# Patient Record
Sex: Male | Born: 1945 | Race: White | Hispanic: No | Marital: Married | Smoking: Former smoker
Health system: Southern US, Community
[De-identification: ages and names within clinical notes are randomized; demographics above are authoritative.]

## PROBLEM LIST (undated history)

## (undated) DIAGNOSIS — R3915 Urgency of urination: Secondary | ICD-10-CM

## (undated) DIAGNOSIS — D126 Benign neoplasm of colon, unspecified: Secondary | ICD-10-CM

## (undated) DIAGNOSIS — R12 Heartburn: Secondary | ICD-10-CM

## (undated) DIAGNOSIS — M1990 Unspecified osteoarthritis, unspecified site: Secondary | ICD-10-CM

## (undated) DIAGNOSIS — H409 Unspecified glaucoma: Secondary | ICD-10-CM

## (undated) DIAGNOSIS — R1310 Dysphagia, unspecified: Secondary | ICD-10-CM

## (undated) DIAGNOSIS — N200 Calculus of kidney: Secondary | ICD-10-CM

## (undated) DIAGNOSIS — E291 Testicular hypofunction: Secondary | ICD-10-CM

## (undated) DIAGNOSIS — E559 Vitamin D deficiency, unspecified: Secondary | ICD-10-CM

## (undated) DIAGNOSIS — Z8711 Personal history of peptic ulcer disease: Secondary | ICD-10-CM

## (undated) DIAGNOSIS — N401 Enlarged prostate with lower urinary tract symptoms: Secondary | ICD-10-CM

## (undated) DIAGNOSIS — E785 Hyperlipidemia, unspecified: Secondary | ICD-10-CM

## (undated) DIAGNOSIS — Z8719 Personal history of other diseases of the digestive system: Secondary | ICD-10-CM

## (undated) DIAGNOSIS — R350 Frequency of micturition: Secondary | ICD-10-CM

## (undated) DIAGNOSIS — E669 Obesity, unspecified: Secondary | ICD-10-CM

## (undated) DIAGNOSIS — N529 Male erectile dysfunction, unspecified: Secondary | ICD-10-CM

## (undated) HISTORY — DX: Personal history of other diseases of the digestive system: Z87.19

## (undated) HISTORY — DX: Vitamin D deficiency, unspecified: E55.9

## (undated) HISTORY — DX: Benign prostatic hyperplasia with lower urinary tract symptoms: N40.1

## (undated) HISTORY — DX: Benign neoplasm of colon, unspecified: D12.6

## (undated) HISTORY — DX: Frequency of micturition: R35.0

## (undated) HISTORY — PX: EYE SURGERY: SHX253

## (undated) HISTORY — PX: LITHOTRIPSY: SUR834

## (undated) HISTORY — DX: Obesity, unspecified: E66.9

## (undated) HISTORY — DX: Urgency of urination: R39.15

## (undated) HISTORY — DX: Dysphagia, unspecified: R13.10

## (undated) HISTORY — DX: Heartburn: R12

## (undated) HISTORY — PX: RETINAL DETACHMENT SURGERY: SHX105

## (undated) HISTORY — PX: APPENDECTOMY: SHX54

## (undated) HISTORY — PX: CATARACT EXTRACTION: SUR2

## (undated) HISTORY — DX: Testicular hypofunction: E29.1

## (undated) HISTORY — DX: Hyperlipidemia, unspecified: E78.5

## (undated) HISTORY — DX: Male erectile dysfunction, unspecified: N52.9

## (undated) HISTORY — DX: Personal history of peptic ulcer disease: Z87.11

---

## 2009-01-02 ENCOUNTER — Ambulatory Visit: Payer: Self-pay | Admitting: Family Medicine

## 2010-10-26 ENCOUNTER — Ambulatory Visit: Payer: Self-pay | Admitting: Family Medicine

## 2010-12-15 ENCOUNTER — Ambulatory Visit: Payer: Self-pay | Admitting: Internal Medicine

## 2014-02-01 ENCOUNTER — Emergency Department: Payer: Self-pay | Admitting: Emergency Medicine

## 2014-02-09 ENCOUNTER — Ambulatory Visit: Payer: Self-pay | Admitting: Orthopedic Surgery

## 2014-04-16 ENCOUNTER — Ambulatory Visit: Payer: Self-pay | Admitting: Emergency Medicine

## 2014-04-19 ENCOUNTER — Ambulatory Visit: Payer: Self-pay | Admitting: Family Medicine

## 2015-03-08 DIAGNOSIS — N401 Enlarged prostate with lower urinary tract symptoms: Secondary | ICD-10-CM

## 2015-03-08 DIAGNOSIS — N138 Other obstructive and reflux uropathy: Secondary | ICD-10-CM | POA: Insufficient documentation

## 2015-05-26 ENCOUNTER — Ambulatory Visit (INDEPENDENT_AMBULATORY_CARE_PROVIDER_SITE_OTHER): Payer: PPO | Admitting: Urology

## 2015-05-26 ENCOUNTER — Other Ambulatory Visit: Payer: Self-pay

## 2015-05-26 ENCOUNTER — Encounter: Payer: Self-pay | Admitting: *Deleted

## 2015-05-26 ENCOUNTER — Ambulatory Visit
Admission: RE | Admit: 2015-05-26 | Discharge: 2015-05-26 | Disposition: A | Discharge status: Home / Self Care | Payer: PPO | Source: Ambulatory Visit | Attending: Urology | Admitting: Urology

## 2015-05-26 ENCOUNTER — Emergency Department
Admission: EM | Admit: 2015-05-26 | Discharge: 2015-05-26 | Disposition: A | Discharge status: Home / Self Care | Payer: PPO | Attending: Emergency Medicine | Admitting: Emergency Medicine

## 2015-05-26 ENCOUNTER — Telehealth: Payer: Self-pay | Admitting: Urology

## 2015-05-26 ENCOUNTER — Other Ambulatory Visit: Payer: Self-pay | Admitting: Urology

## 2015-05-26 ENCOUNTER — Emergency Department: Payer: PPO

## 2015-05-26 ENCOUNTER — Encounter: Payer: Self-pay | Admitting: Urology

## 2015-05-26 ENCOUNTER — Encounter: Payer: Self-pay | Admitting: Urgent Care

## 2015-05-26 VITALS — BP 134/80 | HR 69 | Ht 67.0 in | Wt 148.0 lb

## 2015-05-26 DIAGNOSIS — N200 Calculus of kidney: Secondary | ICD-10-CM | POA: Insufficient documentation

## 2015-05-26 DIAGNOSIS — N201 Calculus of ureter: Secondary | ICD-10-CM

## 2015-05-26 DIAGNOSIS — D126 Benign neoplasm of colon, unspecified: Secondary | ICD-10-CM | POA: Insufficient documentation

## 2015-05-26 DIAGNOSIS — N202 Calculus of kidney with calculus of ureter: Secondary | ICD-10-CM | POA: Diagnosis not present

## 2015-05-26 DIAGNOSIS — H409 Unspecified glaucoma: Secondary | ICD-10-CM | POA: Insufficient documentation

## 2015-05-26 DIAGNOSIS — M1990 Unspecified osteoarthritis, unspecified site: Secondary | ICD-10-CM | POA: Insufficient documentation

## 2015-05-26 DIAGNOSIS — R109 Unspecified abdominal pain: Secondary | ICD-10-CM

## 2015-05-26 HISTORY — DX: Unspecified glaucoma: H40.9

## 2015-05-26 HISTORY — DX: Unspecified osteoarthritis, unspecified site: M19.90

## 2015-05-26 HISTORY — DX: Calculus of kidney: N20.0

## 2015-05-26 LAB — URINALYSIS COMPLETE WITH MICROSCOPIC (ARMC ONLY)
Bilirubin Urine: NEGATIVE
GLUCOSE, UA: NEGATIVE mg/dL
Hgb urine dipstick: NEGATIVE
KETONES UR: NEGATIVE mg/dL
Leukocytes, UA: NEGATIVE
NITRITE: NEGATIVE
PH: 5 (ref 5.0–8.0)
Protein, ur: NEGATIVE mg/dL
SQUAMOUS EPITHELIAL / LPF: NONE SEEN
Specific Gravity, Urine: 1.016 (ref 1.005–1.030)

## 2015-05-26 LAB — BASIC METABOLIC PANEL
Anion gap: 7 (ref 5–15)
BUN: 15 mg/dL (ref 6–20)
CO2: 26 mmol/L (ref 22–32)
Calcium: 8.9 mg/dL (ref 8.9–10.3)
Chloride: 106 mmol/L (ref 101–111)
Creatinine, Ser: 1.12 mg/dL (ref 0.61–1.24)
GFR calc Af Amer: >60 mL/min (ref >60)
GFR calc non Af Amer: >60 mL/min (ref >60)
Glucose, Bld: 142 mg/dL — ABNORMAL HIGH (ref 65–99)
Potassium: 4.1 mmol/L (ref 3.5–5.1)
Sodium: 139 mmol/L (ref 135–145)

## 2015-05-26 LAB — CBC
HCT: 36.7 % — ABNORMAL LOW (ref 40.0–52.0)
HEMOGLOBIN: 12.5 g/dL — AB (ref 13.0–18.0)
MCH: 31.6 pg (ref 26.0–34.0)
MCHC: 34.2 g/dL (ref 32.0–36.0)
MCV: 92.4 fL (ref 80.0–100.0)
Platelets: 198 10*3/uL (ref 150–440)
RBC: 3.98 MIL/uL — ABNORMAL LOW (ref 4.40–5.90)
RDW: 12.9 % (ref 11.5–14.5)
WBC: 9.6 10*3/uL (ref 3.8–10.6)

## 2015-05-26 MED ORDER — TAMSULOSIN HCL 0.4 MG PO CAPS: 0.4000 mg | ORAL_CAPSULE | Freq: Every day | ORAL | Status: AC

## 2015-05-26 MED ORDER — MORPHINE SULFATE 4 MG/ML IJ SOLN
INTRAMUSCULAR | Status: AC
  Filled 2015-05-26: qty 1

## 2015-05-26 MED ORDER — ONDANSETRON HCL 4 MG/2ML IJ SOLN
4.0000 mg | Freq: Once | INTRAMUSCULAR | Status: AC
  Administered 2015-05-26: 4 mg via INTRAVENOUS

## 2015-05-26 MED ORDER — KETOROLAC TROMETHAMINE 30 MG/ML IJ SOLN: 30.0000 mg | Freq: Once | INTRAMUSCULAR | Status: DC

## 2015-05-26 MED ORDER — ONDANSETRON HCL 4 MG/2ML IJ SOLN
INTRAMUSCULAR | Status: AC
  Filled 2015-05-26: qty 2

## 2015-05-26 MED ORDER — OXYCODONE-ACETAMINOPHEN 5-325 MG PO TABS: 1.0000 | ORAL_TABLET | Freq: Four times a day (QID) | ORAL | Status: DC | PRN

## 2015-05-26 MED ORDER — MORPHINE SULFATE 4 MG/ML IJ SOLN
4.0000 mg | Freq: Once | INTRAMUSCULAR | Status: AC
  Administered 2015-05-26: 4 mg via INTRAVENOUS

## 2015-05-26 MED ORDER — SODIUM CHLORIDE 0.9 % IV BOLUS (SEPSIS)
1000.0000 mL | Freq: Once | INTRAVENOUS | Status: AC
  Administered 2015-05-26: 1000 mL via INTRAVENOUS

## 2015-05-26 MED ORDER — ONDANSETRON 4 MG PO TBDP: 4.0000 mg | ORAL_TABLET | Freq: Three times a day (TID) | ORAL | Status: DC | PRN

## 2015-05-26 NOTE — Discharge Instructions (Signed)

## 2015-05-26 NOTE — ED Provider Notes (Signed)
[facility] Emergency Department Provider Note  ____________________________________________  Time seen: Approximately  AM  I have reviewed the triage vital signs and the nursing notes.   HISTORY  Chief Complaint Flank Pain    HPI Dylan Young is a 69 y.o. male who thinks he has a kidney stone.The patient reports that he started having some right-sided flank pain at 1 AM. He reports that he took a few extra strength Tylenol and it helped a tiny bit but the pain has continued. The patient reports his last kidney stone was approximately 15 years ago and he has not seen his urologist since then. The patient has not noticed any blood in his urine or burning with urination. He has had some nausea with no emesis. He reports that his pain is a 9 out of 10 in intensity. He reports that the pain is so bad that he is unable to sit at this time because it just makes the pain worse. He says that the pain has radiated up his back some but has not radiated around to his abdomen or into his groin.   Past Medical History  Diagnosis Date  . Glaucoma   . Kidney stones   . Arthritis     There are no active problems to display for this patient.   Past Surgical History  Procedure Laterality Date  . Eye surgery      No current outpatient prescriptions on file.  Allergies Review of patient's allergies indicates no known allergies.  No family history on file.  Social History History  Substance Use Topics  . Smoking status: Never Smoker   . Smokeless tobacco: Not on file  . Alcohol Use: No    Review of Systems Constitutional: No fever/chills Eyes: No visual changes. ENT: No sore throat. Cardiovascular: Denies chest pain. Respiratory: Denies shortness of breath. Gastrointestinal: Nausea, No abdominal pain.  No vomiting Genitourinary: Negative for dysuria. Musculoskeletal: Right flank pain Skin: Negative for rash. Neurological: Negative for  headaches,  10-point ROS otherwise negative.  ____________________________________________   PHYSICAL EXAM:  VITAL SIGNS: ED Triage Vitals  Enc Vitals Group     BP 05/26/15 0335 143/86 mmHg     Pulse Rate 05/26/15 0335 60     Resp 05/26/15 0335 16     Temp 05/26/15 0335 97.9 F (36.6 C)     Temp Source 05/26/15 0335 Oral     SpO2 05/26/15 0335 100 %     Weight 05/26/15 0335 163 lb (73.936 kg)     Height 05/26/15 0335 5\' 7"  (1.702 m)     Head Cir --      Peak Flow --      Pain Score 05/26/15 0335 8     Pain Loc --      Pain Edu? --      Excl. in [place]? --     Constitutional: Alert and oriented. Well appearing and in no acute distress. Eyes: Conjunctivae are normal. PERRL. EOMI. Head: Atraumatic. Nose: No congestion/rhinnorhea. Mouth/Throat: Mucous membranes are moist.  Oropharynx non-erythematous. Cardiovascular: Normal rate, regular rhythm. Grossly normal heart sounds.  Good peripheral circulation. Respiratory: Normal respiratory effort.  No retractions. Lungs CTAB. Gastrointestinal: Soft and nontender. No distention. Right CVA tenderness. Genitourinary: Deferred Musculoskeletal: No lower extremity tenderness nor edema.  No joint effusions. Neurologic:  Normal speech and language. No gross focal neurologic deficits are appreciated.  Skin:  Skin is warm, dry and intact. No rash noted. Psychiatric: Mood and affect are normal.  ____________________________________________   LABS (all labs ordered are listed, but only abnormal results are displayed)  Labs Reviewed  URINALYSIS COMPLETEWITH MICROSCOPIC ([place]) - Abnormal; Notable for the following:    Color, Urine YELLOW (*)    APPearance CLEAR (*)    Bacteria, UA RARE (*)    All other components within normal limits  BASIC METABOLIC PANEL   ____________________________________________  EKG  None ____________________________________________  RADIOLOGY  CT scan: Obstructing 10 mm stone in the right  proximal ureter just distal to the ureteropelvic junction with resultant mild hydronephrosis, additional nonobstructing stone in the lower right kidney. Multiple scattered small hepatic hypodensities likely a cyst or biliary hamartomas. ____________________________________________   PROCEDURES  Procedure(s) performed: None  Critical Care performed: No  ____________________________________________   INITIAL IMPRESSION / ASSESSMENT AND PLAN / ED COURSE  Pertinent labs & imaging results that were available during my care of the patient were reviewed by me and considered in my medical decision making (see chart for details).  This is a 69 year old male who comes in with right flank pain starting last night. The patient does have significant stone in his right ureter. I will give the patient some morphine and Zofran and normal saline. Tomorrow is the typical day to perform lithotripsies. I will attempt to contact urology to schedule lithotripsy for the patient  I did contact Dr. Erlene Quan who reports that she will attempt to see the patient in clinic so that he may receive a lithotripsy tomorrow. ____________________________________________   FINAL CLINICAL IMPRESSION(S) / ED DIAGNOSES  Final diagnoses:  Flank pain      Loney Hering, MD 05/26/15 [phone]

## 2015-05-26 NOTE — Progress Notes (Signed)
05/26/2015 5:21 PM   Stein Windhorst Earnhart 1946-04-18 [id number]  Referring provider: No referring provider defined for this encounter.  Chief Complaint  Patient presents with  . Nephrolithiasis    New Patient- ED stones    HPI:  69 year old male who presents today for follow-up from the emergency room with a 10 mm right UPJ stone with mild hydronephrosis. His pain started acutely at 1 AM this morning with associated nausea. In the emergency room, his pain was able to be controlled with IV and now oral pain medications.  He had no evidence of leukocytosis, and his UA was negative.  His creatinine was within normal limits.  He does have a personal history of kidney stones, passed a stone 15 years ago spontaneously without need for surgical intervention.  He has seen a urologist in [place] for his kidney stones but he elected to monitor his stone rather than treated at that time.  No gross hematuria, dysuria, or any other significant urinary symptoms today.  No fevers or chills.    PMH: Past Medical History  Diagnosis Date  . Glaucoma   . Kidney stones   . Arthritis     Surgical History: Past Surgical History  Procedure Laterality Date  . Eye surgery      Home Medications:    Medication List       This list is accurate as of: 05/26/15  5:21 PM.  Always use your most recent med list.               bromfenac 0.09 % ophthalmic solution  Commonly known as:  XIBROM  Apply to eye.     Dorzolamide HCl-Timolol Mal PF 22.3-6.8 MG/ML Soln  Place 1 drop into both eyes 2 (two) times daily.     doxazosin 4 MG 24 hr tablet  Commonly known as:  CARDURA XL  Take by mouth.     doxazosin 4 MG tablet  Commonly known as:  CARDURA  Take 4 mg by mouth daily with breakfast.     dutasteride 0.5 MG capsule  Commonly known as:  AVODART  Take 0.5 mg by mouth daily.     latanoprost 0.005 % ophthalmic solution  Commonly known as:  XALATAN  Apply to eye.     MULTIVITAMIN ADULT  PO  Take 1 tablet by mouth daily.     ondansetron 4 MG disintegrating tablet  Commonly known as:  ZOFRAN ODT  Take 1 tablet (4 mg total) by mouth every 8 (eight) hours as needed for nausea or vomiting.     oxyCODONE-acetaminophen 5-325 MG per tablet  Commonly known as:  ROXICET  Take 1-2 tablets by mouth every 6 (six) hours as needed.     tamsulosin 0.4 MG Caps capsule  Commonly known as:  FLOMAX  Take 1 capsule (0.4 mg total) by mouth daily.     VITAMIN B PLUS+ PO  Take 1 tablet by mouth daily.     vitamin C 500 MG tablet  Commonly known as:  ASCORBIC ACID  Take 500 mg by mouth 4 (four) times daily.     Vitamin D-3 1000 UNITS Caps  Take 1 capsule by mouth daily.     vitamin E 400 UNIT capsule  Take 400 Units by mouth daily.        Allergies: No Known Allergies  Family History: History reviewed. No pertinent family history.  Social History:  reports that he has quit smoking. He quit smokeless tobacco use about 40 years  ago. He reports that he does not drink alcohol or use illicit drugs.   ROS: Urological Symptom Review  Patient is experiencing the following symptoms: Frequent urination Hard to postpone urination Get up at night to urinate   Review of Systems  Gastrointestinal (upper)  : Nausea  Gastrointestinal (lower) : Negative for lower GI symptoms  Constitutional : Negative for symptoms  Skin: Negative for skin symptoms  Eyes: Blurred vision  Ear/Nose/Throat : Negative for Ear/Nose/Throat symptoms  Hematologic/Lymphatic: Negative for Hematologic/Lymphatic symptoms  Cardiovascular : Negative for cardiovascular symptoms  Respiratory : Negative for respiratory symptoms  Endocrine: Negative for endocrine symptoms  Musculoskeletal: Back pain  Neurological: Negative for neurological symptoms  Psychologic: Negative for psychiatric symptoms   Physical Exam:  BP 134/80 mmHg  Pulse 69  Ht 5\' 7"  (1.702 m)  Wt 148 lb (67.132 kg)   BMI 23.17 kg/m2    Constitutional:  Alert and oriented, No acute distress. HEENT: [place] AT, moist mucus membranes.  Trachea midline, no masses. Cardiovascular: No clubbing, cyanosis, or edema. Respiratory: Normal respiratory effort, no increased work of breathing. GI: Abdomen is soft, nontender, nondistended, no abdominal masses GU: Mild right CVA tenderness. Skin: No rashes, bruises or suspicious lesions. Lymph: No cervical or inguinal adenopathy. Neurologic: Grossly intact, no focal deficits, moving all 4 extremities. Psychiatric: Normal mood and affect.  Laboratory Data: Lab Results  Component Value Date   WBC 9.6 05/26/2015   HGB 12.5* 05/26/2015   HCT 36.7* 05/26/2015   MCV 92.4 05/26/2015   PLT 198 05/26/2015    Lab Results  Component Value Date   CREATININE 1.12 05/26/2015    No results found for: PSA  No results found for: TESTOSTERONE  No results found for: HGBA1C  Urinalysis    Component Value Date/Time   COLORURINE YELLOW* 05/26/2015 0343   APPEARANCEUR CLEAR* 05/26/2015 0343   LABSPEC 1.016 05/26/2015 0343   PHURINE 5.0 05/26/2015 0343   GLUCOSEU NEGATIVE 05/26/2015 0343   HGBUR NEGATIVE 05/26/2015 0343   [place] NEGATIVE 05/26/2015 0343   [place] 05/26/2015 0343   PROTEINUR NEGATIVE 05/26/2015 0343   NITRITE NEGATIVE 05/26/2015 0343   LEUKOCYTESUR NEGATIVE 05/26/2015 0343    Pertinent Imaging: CT abdomen/pelvis without contrast 05/26/2015 IMPRESSION: 1. Obstructing 10 mm stone in the right proximal ureter just distal to the ureteropelvic junction with resultant mild hydronephrosis. Additional nonobstructing stone in the lower right kidney. 2. Multiple scattered small hepatic hypodensities, likely a cysts or biliary hamartomas. 3. Incidental findings of small hiatal hernia and atherosclerosis.  KUB 05/26/15 IMPRESSION: 8 mm right ureteral calculus at the level of the right L3 transverse process.    Assessment & Plan:  A  69 year old male with a 10 mm right UPJ stone and mild hydronephrosis.  We discussed the given the size of the stone, he is a fairly low chance of passing it spontaneously. He would prefer to proceed with intervention for his stone. Various options were discussed today including ESWL and ureteroscopy were discussed. The benefits of each were discussed in detail. He is elected proceed with right ESWL. All of the risks specific to this procedure were discussed in detail including risk of infection, bleeding, failure of the procedure, obstructing ureteral fragments, need for further procedures reviewed. The procedure scheduled for tomorrow.  Problem List Items Addressed This Visit    Ureteral calculus, right    Other Visit Diagnoses    Obstruction of right ureteropelvic junction (UPJ) due to stone    -  Primary  Return schedule surgery ESWL tomorrow.  [facility] [address], [place], [place] [address] [phone]

## 2015-05-26 NOTE — Telephone Encounter (Signed)
Erroneous phone note.

## 2015-05-26 NOTE — ED Notes (Addendum)
Patient presents with c/o RIGHT flank pain - started at 0100 this am. Of note, patient has had "the same stone" for 15 years - reports that it is a 31mm and was told that it may never come out; does report passing smaller stones over the aforementioned time span. Denies N/V and urinary symptoms.

## 2015-05-26 NOTE — Patient Instructions (Addendum)
See [facility] instructions.

## 2015-05-27 ENCOUNTER — Ambulatory Visit
Admission: RE | Admit: 2015-05-27 | Discharge: 2015-05-27 | Disposition: A | Discharge status: Home / Self Care | Payer: PPO | Source: Ambulatory Visit | Attending: Urology | Admitting: Urology

## 2015-05-27 ENCOUNTER — Encounter
Admission: RE | Disposition: A | Discharge status: Home / Self Care | Payer: Self-pay | Source: Ambulatory Visit | Attending: Urology

## 2015-05-27 ENCOUNTER — Ambulatory Visit: Payer: PPO

## 2015-05-27 ENCOUNTER — Encounter: Payer: Self-pay | Admitting: *Deleted

## 2015-05-27 ENCOUNTER — Other Ambulatory Visit: Payer: Self-pay | Admitting: Urology

## 2015-05-27 DIAGNOSIS — Z87442 Personal history of urinary calculi: Secondary | ICD-10-CM | POA: Diagnosis not present

## 2015-05-27 DIAGNOSIS — N201 Calculus of ureter: Secondary | ICD-10-CM | POA: Insufficient documentation

## 2015-05-27 DIAGNOSIS — H409 Unspecified glaucoma: Secondary | ICD-10-CM | POA: Insufficient documentation

## 2015-05-27 DIAGNOSIS — Z87891 Personal history of nicotine dependence: Secondary | ICD-10-CM | POA: Insufficient documentation

## 2015-05-27 DIAGNOSIS — M1990 Unspecified osteoarthritis, unspecified site: Secondary | ICD-10-CM | POA: Diagnosis not present

## 2015-05-27 DIAGNOSIS — Z79899 Other long term (current) drug therapy: Secondary | ICD-10-CM | POA: Diagnosis not present

## 2015-05-27 DIAGNOSIS — N200 Calculus of kidney: Secondary | ICD-10-CM

## 2015-05-27 HISTORY — PX: EXTRACORPOREAL SHOCK WAVE LITHOTRIPSY: SHX1557

## 2015-05-27 SURGERY — LITHOTRIPSY, ESWL
Anesthesia: Moderate Sedation | Laterality: Right

## 2015-05-27 MED ORDER — CIPROFLOXACIN HCL 500 MG PO TABS
ORAL_TABLET | ORAL | Status: AC
  Administered 2015-05-27: 500 mg via ORAL
  Filled 2015-05-27: qty 1

## 2015-05-27 MED ORDER — DEXTROSE-NACL 5-0.45 % IV SOLN
INTRAVENOUS | Status: DC
  Administered 2015-05-27: 07:00:00 via INTRAVENOUS

## 2015-05-27 MED ORDER — DIPHENHYDRAMINE HCL 25 MG PO CAPS
25.0000 mg | ORAL_CAPSULE | ORAL | Status: AC
  Administered 2015-05-27: 25 mg via ORAL

## 2015-05-27 MED ORDER — CIPROFLOXACIN HCL 500 MG PO TABS
500.0000 mg | ORAL_TABLET | Freq: Once | ORAL | Status: AC
  Administered 2015-05-27: 500 mg via ORAL

## 2015-05-27 MED ORDER — DIAZEPAM 5 MG PO TABS
ORAL_TABLET | ORAL | Status: AC
  Administered 2015-05-27: 5 mg via ORAL
  Filled 2015-05-27: qty 2

## 2015-05-27 MED ORDER — DIPHENHYDRAMINE HCL 25 MG PO CAPS
ORAL_CAPSULE | ORAL | Status: AC
  Administered 2015-05-27: 25 mg via ORAL
  Filled 2015-05-27: qty 1

## 2015-05-27 MED ORDER — OXYCODONE HCL 5 MG PO TABS: 5.0000 mg | ORAL_TABLET | ORAL | Status: DC | PRN

## 2015-05-27 MED ORDER — DIAZEPAM 5 MG PO TABS
10.0000 mg | ORAL_TABLET | ORAL | Status: AC
  Administered 2015-05-27: 5 mg via ORAL

## 2015-05-27 NOTE — Discharge Instructions (Signed)
See [facility] discharge instructions in chart. AMBULATORY SURGERY  DISCHARGE INSTRUCTIONS   1) The drugs that you were given will stay in your system until tomorrow so for the next 24 hours you should not:  A) Drive an automobile B) Make any legal decisions C) Drink any alcoholic beverage   2) You may resume regular meals tomorrow.  Today it is better to start with liquids and gradually work up to solid foods.  You may eat anything you prefer, but it is better to start with liquids, then soup and crackers, and gradually work up to solid foods.   3) Please notify your doctor immediately if you have any unusual bleeding, trouble breathing, redness and pain at the surgery site, drainage, fever, or pain not relieved by medication. 4)                          5) Additional [phone]

## 2015-05-27 NOTE — H&P (Signed)
See paper H&P 

## 2015-06-11 ENCOUNTER — Ambulatory Visit
Admission: RE | Admit: 2015-06-11 | Discharge: 2015-06-11 | Disposition: A | Discharge status: Home / Self Care | Payer: PPO | Source: Ambulatory Visit | Attending: Urology | Admitting: Urology

## 2015-06-11 ENCOUNTER — Ambulatory Visit (INDEPENDENT_AMBULATORY_CARE_PROVIDER_SITE_OTHER): Payer: PPO | Admitting: Urology

## 2015-06-11 ENCOUNTER — Encounter: Payer: Self-pay | Admitting: Urology

## 2015-06-11 VITALS — BP 116/71 | HR 76 | Ht 67.0 in | Wt 163.5 lb

## 2015-06-11 DIAGNOSIS — N400 Enlarged prostate without lower urinary tract symptoms: Secondary | ICD-10-CM

## 2015-06-11 DIAGNOSIS — N200 Calculus of kidney: Secondary | ICD-10-CM

## 2015-06-11 DIAGNOSIS — N201 Calculus of ureter: Secondary | ICD-10-CM

## 2015-06-11 LAB — MICROSCOPIC EXAMINATION: Bacteria, UA: NONE SEEN

## 2015-06-11 LAB — URINALYSIS, COMPLETE
BILIRUBIN UA: NEGATIVE
GLUCOSE, UA: NEGATIVE
Ketones, UA: NEGATIVE
Leukocytes, UA: NEGATIVE
Nitrite, UA: NEGATIVE
RBC, UA: NEGATIVE
Specific Gravity, UA: 1.025 (ref 1.005–1.030)
Urobilinogen, Ur: 0.2 mg/dL (ref 0.2–1.0)
pH, UA: 6 (ref 5.0–7.5)

## 2015-06-11 NOTE — Progress Notes (Signed)
1:59 PM   Dylan Young 03/22/1946 [id number]  Referring provider: No referring provider defined for this encounter.  Chief Complaint  Patient presents with  . Follow-up    lithotripsy x 2 wks ago    HPI:  69 year old male who presented with a 10 mm right UPJ stone with mild hydronephrosis.  He underwent right ESWL on 05/27/2015 which was uncomplicated and returned today for routine follow-up.  He has not had any significant pain or hematuria.  He has passed several fragments which he brings with him today.  He does have a personal history of kidney stones, passed a stone 15 years ago spontaneously without need for surgical intervention.  He has seen a urologist in [place] for his kidney stones but he elected to monitor his stone rather than treated at that time.  No gross hematuria, dysuria, or any other significant urinary symptoms today.  No fevers or chills.   He does also have a history of BPH currently on doxazosin with very well-controlled symptoms.  PMH: Past Medical History  Diagnosis Date  . Glaucoma   . Kidney stones   . Arthritis     Surgical History: Past Surgical History  Procedure Laterality Date  . Eye surgery    . Lithotripsy      Home Medications:    Medication List       This list is accurate as of: 06/11/15 11:59 PM.  Always use your most recent med list.               bromfenac 0.09 % ophthalmic solution  Commonly known as:  XIBROM  Apply to eye.     Dorzolamide HCl-Timolol Mal PF 22.3-6.8 MG/ML Soln  Place 1 drop into both eyes 2 (two) times daily.     doxazosin 4 MG 24 hr tablet  Commonly known as:  CARDURA XL  Take by mouth.     doxazosin 4 MG tablet  Commonly known as:  CARDURA  Take 4 mg by mouth daily with breakfast.     dutasteride 0.5 MG capsule  Commonly known as:  AVODART  Take 0.5 mg by mouth daily.     latanoprost 0.005 % ophthalmic solution  Commonly known as:  XALATAN  Apply to eye.     MULTIVITAMIN  ADULT PO  Take 1 tablet by mouth daily.     ondansetron 4 MG disintegrating tablet  Commonly known as:  ZOFRAN ODT  Take 1 tablet (4 mg total) by mouth every 8 (eight) hours as needed for nausea or vomiting.     oxyCODONE-acetaminophen 5-325 MG per tablet  Commonly known as:  ROXICET  Take 1-2 tablets by mouth every 6 (six) hours as needed.     VITAMIN B PLUS+ PO  Take 1 tablet by mouth daily.     vitamin C 500 MG tablet  Commonly known as:  ASCORBIC ACID  Take 500 mg by mouth 4 (four) times daily.     Vitamin D-3 1000 UNITS Caps  Take 1 capsule by mouth daily.     vitamin E 400 UNIT capsule  Take 400 Units by mouth daily.        Allergies: No Known Allergies  Family History: Family History  Problem Relation Age of Onset  . Cancer Father     Social History:  reports that he has quit smoking. He quit smokeless tobacco use about 40 years ago. He reports that he drinks about 0.6 oz of alcohol per week. He  reports that he does not use illicit drugs.   Physical Exam:  BP 116/71 mmHg  Pulse 76  Ht 5\' 7"  (1.702 m)  Wt 163 lb 8 oz (74.163 kg)  BMI 25.60 kg/m2  Constitutional:  Alert and oriented, No acute distress. HEENT: [place] AT, moist mucus membranes.  Trachea midline, no masses. Cardiovascular: No clubbing, cyanosis, or edema. Respiratory: Normal respiratory effort, no increased work of breathing. GI: Abdomen is soft, nontender, nondistended, no abdominal masses GU: Mild right CVA tenderness. Skin: No rashes, bruises or suspicious lesions. Neurologic: Grossly intact, no focal deficits, moving all 4 extremities. Psychiatric: Normal mood and affect.   Laboratory Data: Lab Results  Component Value Date   WBC 9.6 05/26/2015   HGB 12.5* 05/26/2015   HCT 36.7* 05/26/2015   MCV 92.4 05/26/2015   PLT 198 05/26/2015    Lab Results  Component Value Date   CREATININE 1.12 05/26/2015     Urinalysis Results for orders placed or performed in visit on 06/11/15    Microscopic Examination  Result Value Ref Range   WBC, UA 0-5 0 -  5 /hpf   RBC, UA 0-2 0 -  2 /hpf   Epithelial Cells (non renal) 0-10 0 - 10 /hpf   Mucus, UA Present (A) Not Estab.   Bacteria, UA None seen None seen/Few  Urinalysis, Complete  Result Value Ref Range   Specific Gravity, UA 1.025 1.005 - 1.030   pH, UA 6.0 5.0 - 7.5   Color, UA Yellow Yellow   Appearance Ur Clear Clear   Leukocytes, UA Negative Negative   Protein, UA Trace (A) Negative/Trace   Glucose, UA Negative Negative   Ketones, UA Negative Negative   RBC, UA Negative Negative   Bilirubin, UA Negative Negative   Urobilinogen, Ur 0.2 0.2 - 1.0 mg/dL   Nitrite, UA Negative Negative   Microscopic Examination See below:     Pertinent Imaging: CT abdomen/pelvis without contrast 05/26/2015 IMPRESSION: 1. Obstructing 10 mm stone in the right proximal ureter just distal to the ureteropelvic junction with resultant mild hydronephrosis. Additional nonobstructing stone in the lower right kidney. 2. Multiple scattered small hepatic hypodensities, likely a cysts or biliary hamartomas. 3. Incidental findings of small hiatal hernia and atherosclerosis.  KUB 05/26/15 IMPRESSION: 8 mm right ureteral calculus at the level of the right L3 transverse process.   KUB 06/11/15 IMPRESSION: Small presumed residual calculus in the right ureter the level of L3-4, smaller than on the prior study. Previously noted small calcification in the lower pole right kidney is not seen at this time. No new calcifications identified. Bowel gas pattern unremarkable.   Assessment & Plan:    1. Renal stones KUB today shows smaller residual right fragment.  Asymptomatic.  Given size, possible that fragment should pass on its own.  Recommend copious oral hydration, continuation of alpha blocker, and  2-3 weeks for KUB to reassess.   Discussed returning sooner with pain or to ED with fevers. - Urinalysis, Complete - Abdomen 1 view (KUB);  Future  2. Right ureteral stone See above  3. BPH (benign prostatic hyperplasia) Symptoms well controlled on doxazosin.  Return in about 3 weeks (around 07/02/2015) for KUB prior.  [facility] [address], [place], [place] [address] [phone]

## 2015-06-11 NOTE — Patient Instructions (Signed)
Dietary Guidelines to Help Prevent Kidney Stones  Your risk of kidney stones can be decreased by adjusting the foods you eat. The most important thing you can do is drink enough fluid. You should drink enough fluid to keep your urine clear or pale yellow. The following guidelines provide specific information for the type of kidney stone you have had.  GUIDELINES ACCORDING TO TYPE OF KIDNEY STONE  Calcium Oxalate Kidney Stones  · Reduce the amount of salt you eat. Foods that have a lot of salt cause your body to release excess calcium into your urine. The excess calcium can combine with a substance called oxalate to form kidney stones.  · Reduce the amount of animal protein you eat if the amount you eat is excessive. Animal protein causes your body to release excess calcium into your urine. Ask your dietitian how much protein from animal sources you should be eating.  · Avoid foods that are high in oxalates. If you take vitamins, they should have less than 500 mg of vitamin C. Your body turns vitamin C into oxalates. You do not need to avoid fruits and vegetables high in vitamin C.  Calcium Phosphate Kidney Stones  · Reduce the amount of salt you eat to help prevent the release of excess calcium into your urine.  · Reduce the amount of animal protein you eat if the amount you eat is excessive. Animal protein causes your body to release excess calcium into your urine. Ask your dietitian how much protein from animal sources you should be eating.  · Get enough calcium from food or take a calcium supplement (ask your dietitian for recommendations). Food sources of calcium that do not increase your risk of kidney stones include:  ¨ Broccoli.  ¨ Dairy products, such as cheese and yogurt.  ¨ Pudding.  Uric Acid Kidney Stones  · Do not have more than 6 oz of animal protein per day.  FOOD SOURCES  Animal Protein Sources  · Meat (all types).  · Poultry.  · Eggs.  · Fish, seafood.  Foods High in Salt  · Salt seasonings.  · Soy  sauce.  · Teriyaki sauce.  · Cured and processed meats.  · Salted crackers and snack foods.  · Fast food.  · Canned soups and most canned foods.  Foods High in Oxalates  · Grains:  ¨ Amaranth.  ¨ Barley.  ¨ Grits.  ¨ Wheat germ.  ¨ Bran.  ¨ Buckwheat flour.  ¨ All bran cereals.  ¨ Pretzels.  ¨ Whole wheat bread.  · Vegetables:  ¨ Beans (wax).  ¨ Beets and beet greens.  ¨ Collard greens.  ¨ Eggplant.  ¨ Escarole.  ¨ Leeks.  ¨ Okra.  ¨ Parsley.  ¨ Rutabagas.  ¨ Spinach.  ¨ Swiss chard.  ¨ Tomato paste.  ¨ Fried potatoes.  ¨ Sweet potatoes.  · Fruits:  ¨ Red currants.  ¨ Figs.  ¨ Kiwi.  ¨ Rhubarb.  · Meat and Other Protein Sources:  ¨ Beans (dried).  ¨ Soy burgers and other soybean products.  ¨ Miso.  ¨ Nuts (peanuts, almonds, pecans, cashews, hazelnuts).  ¨ Nut butters.  ¨ Sesame seeds and tahini (paste made of sesame seeds).  ¨ Poppy seeds.  · Beverages:  ¨ Chocolate drink mixes.  ¨ Soy milk.  ¨ Instant iced tea.  ¨ Juices made from high-oxalate fruits or vegetables.  · Other:  ¨ Carob.  ¨ Chocolate.  ¨ Fruitcake.  ¨ Marmalades.  Document Released:   04/07/2011 Document Revised: 12/16/2013 Document Reviewed: 11/07/2013  [employer]® Patient Information ©2015 [employer]. This information is not intended to replace advice given to you by your health care provider. Make sure you discuss any questions you have with your health care provider.

## 2015-06-30 ENCOUNTER — Other Ambulatory Visit: Payer: Self-pay | Admitting: Urology

## 2015-07-09 ENCOUNTER — Ambulatory Visit (INDEPENDENT_AMBULATORY_CARE_PROVIDER_SITE_OTHER): Payer: PPO | Admitting: Urology

## 2015-07-09 ENCOUNTER — Ambulatory Visit
Admission: RE | Admit: 2015-07-09 | Discharge: 2015-07-09 | Disposition: A | Discharge status: Home / Self Care | Payer: PPO | Source: Ambulatory Visit | Attending: Urology | Admitting: Urology

## 2015-07-09 ENCOUNTER — Encounter: Payer: Self-pay | Admitting: Urology

## 2015-07-09 VITALS — BP 133/81 | HR 70 | Ht 67.0 in | Wt 169.3 lb

## 2015-07-09 DIAGNOSIS — N200 Calculus of kidney: Secondary | ICD-10-CM

## 2015-07-09 DIAGNOSIS — N201 Calculus of ureter: Secondary | ICD-10-CM | POA: Insufficient documentation

## 2015-07-09 DIAGNOSIS — N400 Enlarged prostate without lower urinary tract symptoms: Secondary | ICD-10-CM

## 2015-07-09 LAB — MICROSCOPIC EXAMINATION: Bacteria, UA: NONE SEEN

## 2015-07-09 LAB — URINALYSIS, COMPLETE
Bilirubin, UA: NEGATIVE
Glucose, UA: NEGATIVE
Ketones, UA: NEGATIVE
Leukocytes, UA: NEGATIVE
NITRITE UA: NEGATIVE
PH UA: 7 (ref 5.0–7.5)
Protein, UA: NEGATIVE
RBC, UA: NEGATIVE
Specific Gravity, UA: 1.015 (ref 1.005–1.030)
UUROB: 0.2 mg/dL (ref 0.2–1.0)

## 2015-07-11 ENCOUNTER — Encounter: Payer: Self-pay | Admitting: Urology

## 2015-07-11 NOTE — Progress Notes (Signed)
12:56 PM   Dylan Young [birth year] [id number]  Referring provider: Sherrin Daisy, MD [place], [place] [address]  Chief Complaint  Patient presents with  . Follow-up    KUB    HPI:  69 year old male who presented with a 10 mm right UPJ stone with mild hydronephrosis.  He underwent right ESWL on 05/27/2015 which was uncomplicated.  He passed multiple fragments in the distal 2 week follow-up visit, he did have some small residual fragments at L3-L4. He was asymptomatic the time. He returns today again 2 weeks later for repeat KUB.   He has not had any significant pain or hematuria.  No fevers or chills. He has been doing well and had no pain.  He does have a personal history of kidney stones, passed a stone 15 years ago spontaneously without need for surgical intervention.  He has seen a urologist in [place] for his kidney stones but he elected to monitor his stone rather than treated at that time.   He does also have a history of BPH currently on doxazosin with very well-controlled symptoms.  PMH: Past Medical History  Diagnosis Date  . Glaucoma   . Kidney stones   . Arthritis   . Hyperlipidemia   . Dysphagia   . Vitamin D deficiency   . Benign neoplasm of colon   . Heart burn   . History of stomach ulcers   . Obesity   . Benign prostatic hypertrophy with lower urinary tract symptoms (LUTS)   . ED (erectile dysfunction)   . Urinary frequency   . Urinary urgency   . Hypogonadism male   . Urinary frequency     Surgical History: Past Surgical History  Procedure Laterality Date  . Eye surgery    . Lithotripsy    . Appendectomy      Home Medications:    Medication List       This list is accurate as of: 07/09/15 11:59 PM.  Always use your most recent med list.               bromfenac 0.09 % ophthalmic solution  Commonly known as:  XIBROM  Apply to eye.     Dorzolamide HCl-Timolol Mal PF 22.3-6.8 MG/ML Soln  Place 1 drop into both eyes  2 (two) times daily.     doxazosin 4 MG 24 hr tablet  Commonly known as:  CARDURA XL  Take by mouth.     dutasteride 0.5 MG capsule  Commonly known as:  AVODART  Take 0.5 mg by mouth daily.     latanoprost 0.005 % ophthalmic solution  Commonly known as:  XALATAN  Apply to eye.     MULTIVITAMIN ADULT PO  Take 1 tablet by mouth daily.     VITAMIN B PLUS+ PO  Take 1 tablet by mouth daily.     vitamin C 500 MG tablet  Commonly known as:  ASCORBIC ACID  Take 500 mg by mouth 4 (four) times daily.     Vitamin D-3 1000 UNITS Caps  Take 1 capsule by mouth daily.     vitamin E 400 UNIT capsule  Take 400 Units by mouth daily.        Allergies: No Known Allergies  Family History: Family History  Problem Relation Age of Onset  . Prostate cancer Father   . Alzheimer's disease Father   . Diabetes Mother   . Stroke Mother     Social History:  reports  that he has quit smoking. He quit smokeless tobacco use about 40 years ago. He reports that he drinks about 0.6 oz of alcohol per week. He reports that he does not use illicit drugs.    ROS: UROLOGY Frequent Urination?: No Hard to postpone urination?: No Burning/pain with urination?: No Get up at night to urinate?: Yes Leakage of urine?: No Urine stream starts and stops?: No Trouble starting stream?: No Do you have to strain to urinate?: No Blood in urine?: No Urinary tract infection?: No Sexually transmitted disease?: No Injury to kidneys or bladder?: No Painful intercourse?: No Weak stream?: No Erection problems?: No Penile pain?: No Gastrointestinal Nausea?: No Vomiting?: No Indigestion/heartburn?: No Diarrhea?: No Constipation?: No Constitutional Fever: No Night sweats?: No Weight loss?: No Fatigue?: No Skin Skin rash/lesions?: No Itching?: No Eyes Blurred vision?: No Double vision?: No Ears/Nose/Throat Sore throat?: No Sinus problems?: No Hematologic/Lymphatic Swollen glands?: No Easy bruising?:  No Cardiovascular Leg swelling?: No Chest pain?: No Respiratory Cough?: No Shortness of breath?: No Endocrine Excessive thirst?: No Musculoskeletal Back pain?: No Joint pain?: No Neurological Headaches?: No Dizziness?: No Psychologic Depression?: No Anxiety?: No  Physical Exam: BP 133/81 mmHg  Pulse 70  Ht 5\' 7"  (1.702 m)  Wt 169 lb 4.8 oz (76.794 kg)  BMI 26.51 kg/m2  Constitutional:  Alert and oriented, No acute distress. HEENT: [place] AT, moist mucus membranes.  Trachea midline, no masses. Cardiovascular: No clubbing, cyanosis, or edema. Respiratory: Normal respiratory effort, no increased work of breathing. GI: Abdomen is soft, nontender, nondistended, no abdominal masses GU: Mild right CVA tenderness. Skin: No rashes, bruises or suspicious lesions. Neurologic: Grossly intact, no focal deficits, moving all 4 extremities. Psychiatric: Normal mood and affect.   Laboratory Data: Lab Results  Component Value Date   WBC 9.6 05/26/2015   HGB 12.5* 05/26/2015   HCT 36.7* 05/26/2015   MCV 92.4 05/26/2015   PLT 198 05/26/2015    Lab Results  Component Value Date   CREATININE 1.12 05/26/2015     Urinalysis Results for orders placed or performed in visit on 07/09/15  Microscopic Examination  Result Value Ref Range   WBC, UA 0-5 0 -  5 /hpf   RBC, UA 0-2 0 -  2 /hpf   Epithelial Cells (non renal) 0-10 0 - 10 /hpf   Bacteria, UA None seen None seen/Few  Urinalysis, Complete  Result Value Ref Range   Specific Gravity, UA 1.015 1.005 - 1.030   pH, UA 7.0 5.0 - 7.5   Color, UA Yellow Yellow   Appearance Ur Clear Clear   Leukocytes, UA Negative Negative   Protein, UA Negative Negative/Trace   Glucose, UA Negative Negative   Ketones, UA Negative Negative   RBC, UA Negative Negative   Bilirubin, UA Negative Negative   Urobilinogen, Ur 0.2 0.2 - 1.0 mg/dL   Nitrite, UA Negative Negative   Microscopic Examination See below:     Pertinent Imaging: CT  abdomen/pelvis without contrast 05/26/2015 IMPRESSION: 1. Obstructing 10 mm stone in the right proximal ureter just distal to the ureteropelvic junction with resultant mild hydronephrosis. Additional nonobstructing stone in the lower right kidney. 2. Multiple scattered small hepatic hypodensities, likely a cysts or biliary hamartomas. 3. Incidental findings of small hiatal hernia and atherosclerosis.  KUB 05/26/15 IMPRESSION: 8 mm right ureteral calculus at the level of the right L3 transverse process.   KUB 06/11/15 IMPRESSION: Small presumed residual calculus in the right ureter the level of L3-4, smaller than on the  prior study. Previously noted small calcification in the lower pole right kidney is not seen at this time. No new calcifications identified. Bowel gas pattern Unremarkable.  KUB 07/09/15 IMPRESSION: Previously identified right mid ureteral calculus is now projected over the right lower pelvis, most likely in the distal right ureter or bladder.  Assessment & Plan:    1. Renal stones KUB today shows smaller residual right fragment now migrated into right lower pelvis.  Asymptomatic.  Given size, possible that fragment should pass on its own.  Recommend copious oral hydration, continuation of alpha blocker, and  2-3 weeks for KUB to reassess (will call with results).   Discussed returning sooner with pain or to ED with fevers. - Urinalysis, Complete - Abdomen 1 view (KUB); Future -call with KUB results -If stone persists, we'll get a ultrasound to ensure no height to and possibly consider intervention as needed  2. Right ureteral stone See above  3. BPH (benign prostatic hyperplasia) Symptoms well controlled on doxazosin.  Return in about 1 year (around 07/08/2016) for KUB.  [facility] [address] [place], [place] [address] [phone]

## 2015-07-23 ENCOUNTER — Ambulatory Visit
Admission: RE | Admit: 2015-07-23 | Discharge: 2015-07-23 | Disposition: A | Discharge status: Home / Self Care | Payer: PPO | Source: Ambulatory Visit | Attending: Urology | Admitting: Urology

## 2015-07-23 DIAGNOSIS — N200 Calculus of kidney: Secondary | ICD-10-CM | POA: Diagnosis not present

## 2015-07-25 ENCOUNTER — Telehealth: Payer: Self-pay | Admitting: Urology

## 2015-07-25 DIAGNOSIS — N201 Calculus of ureter: Secondary | ICD-10-CM

## 2015-07-25 NOTE — Telephone Encounter (Signed)
Please let patient know that that small ureteral fragment is still present.    I would like him to have a follow up renal ultrasound to assess for any obstruction/ hydronephrosis.    Order placed.    Will call with results.

## 2015-07-26 NOTE — Telephone Encounter (Signed)
I spoke w/ the pt. and relayed Dr. Cherrie Gauze instructions.  I explained that the order has been placed for the RUS.  The pt indicated understanding and agreed to having the RUS done within the next few days. . . Marland Kitchen sm

## 2015-08-05 ENCOUNTER — Ambulatory Visit
Admission: RE | Admit: 2015-08-05 | Discharge: 2015-08-05 | Disposition: A | Discharge status: Home / Self Care | Payer: PPO | Source: Ambulatory Visit | Attending: Urology | Admitting: Urology

## 2015-08-05 DIAGNOSIS — N201 Calculus of ureter: Secondary | ICD-10-CM | POA: Diagnosis not present

## 2015-08-06 ENCOUNTER — Telehealth: Payer: Self-pay

## 2015-08-06 DIAGNOSIS — N200 Calculus of kidney: Secondary | ICD-10-CM

## 2015-08-06 NOTE — Telephone Encounter (Signed)
Spoke with pt and made aware of u/s results. Pt voiced understanding. Pt was transferred to the front to make f/u appt. KUB orders placed.

## 2015-08-06 NOTE — Telephone Encounter (Signed)
-----   Message from Hollice Espy, MD sent at 08/05/2015  4:23 PM EDT ----- Please let Mr Dylan Young know that his renal ultrasound looked great.  No hydronephrosis or swelling of the kidney to suggest residual obstructing fragment.  I would like to see him in ~6 months with KUB.  Hollice Espy, MD

## 2015-09-03 ENCOUNTER — Encounter: Payer: Self-pay | Admitting: Urology

## 2015-12-28 DIAGNOSIS — H401133 Primary open-angle glaucoma, bilateral, severe stage: Secondary | ICD-10-CM | POA: Diagnosis not present

## 2016-02-03 DIAGNOSIS — H5231 Anisometropia: Secondary | ICD-10-CM | POA: Diagnosis not present

## 2016-02-11 ENCOUNTER — Ambulatory Visit: Payer: PPO | Admitting: Urology

## 2016-03-07 DIAGNOSIS — Z1211 Encounter for screening for malignant neoplasm of colon: Secondary | ICD-10-CM | POA: Diagnosis not present

## 2016-03-07 DIAGNOSIS — Z1212 Encounter for screening for malignant neoplasm of rectum: Secondary | ICD-10-CM | POA: Diagnosis not present

## 2016-03-07 DIAGNOSIS — R030 Elevated blood-pressure reading, without diagnosis of hypertension: Secondary | ICD-10-CM | POA: Diagnosis not present

## 2016-03-27 DIAGNOSIS — Z86010 Personal history of colonic polyps: Secondary | ICD-10-CM | POA: Diagnosis not present

## 2016-03-28 DIAGNOSIS — H401133 Primary open-angle glaucoma, bilateral, severe stage: Secondary | ICD-10-CM | POA: Diagnosis not present

## 2016-05-05 ENCOUNTER — Encounter: Payer: Self-pay | Admitting: *Deleted

## 2016-05-08 ENCOUNTER — Ambulatory Visit: Payer: PPO | Admitting: Anesthesiology

## 2016-05-08 ENCOUNTER — Ambulatory Visit
Admission: RE | Admit: 2016-05-08 | Discharge: 2016-05-08 | Disposition: A | Discharge status: Home / Self Care | Payer: PPO | Source: Ambulatory Visit | Attending: Gastroenterology | Admitting: Gastroenterology

## 2016-05-08 ENCOUNTER — Encounter
Admission: RE | Disposition: A | Discharge status: Home / Self Care | Payer: Self-pay | Source: Ambulatory Visit | Attending: Gastroenterology

## 2016-05-08 DIAGNOSIS — N401 Enlarged prostate with lower urinary tract symptoms: Secondary | ICD-10-CM | POA: Diagnosis not present

## 2016-05-08 DIAGNOSIS — K5730 Diverticulosis of large intestine without perforation or abscess without bleeding: Secondary | ICD-10-CM | POA: Insufficient documentation

## 2016-05-08 DIAGNOSIS — M1990 Unspecified osteoarthritis, unspecified site: Secondary | ICD-10-CM | POA: Diagnosis not present

## 2016-05-08 DIAGNOSIS — Z6825 Body mass index (BMI) 25.0-25.9, adult: Secondary | ICD-10-CM | POA: Insufficient documentation

## 2016-05-08 DIAGNOSIS — Z8719 Personal history of other diseases of the digestive system: Secondary | ICD-10-CM | POA: Diagnosis not present

## 2016-05-08 DIAGNOSIS — E785 Hyperlipidemia, unspecified: Secondary | ICD-10-CM | POA: Insufficient documentation

## 2016-05-08 DIAGNOSIS — K635 Polyp of colon: Secondary | ICD-10-CM | POA: Diagnosis not present

## 2016-05-08 DIAGNOSIS — D123 Benign neoplasm of transverse colon: Secondary | ICD-10-CM | POA: Insufficient documentation

## 2016-05-08 DIAGNOSIS — Z9841 Cataract extraction status, right eye: Secondary | ICD-10-CM | POA: Insufficient documentation

## 2016-05-08 DIAGNOSIS — E559 Vitamin D deficiency, unspecified: Secondary | ICD-10-CM | POA: Insufficient documentation

## 2016-05-08 DIAGNOSIS — D124 Benign neoplasm of descending colon: Secondary | ICD-10-CM | POA: Diagnosis not present

## 2016-05-08 DIAGNOSIS — Z87442 Personal history of urinary calculi: Secondary | ICD-10-CM | POA: Insufficient documentation

## 2016-05-08 DIAGNOSIS — E669 Obesity, unspecified: Secondary | ICD-10-CM | POA: Insufficient documentation

## 2016-05-08 DIAGNOSIS — R1310 Dysphagia, unspecified: Secondary | ICD-10-CM | POA: Diagnosis not present

## 2016-05-08 DIAGNOSIS — Z86010 Personal history of colonic polyps: Secondary | ICD-10-CM | POA: Insufficient documentation

## 2016-05-08 DIAGNOSIS — Z87891 Personal history of nicotine dependence: Secondary | ICD-10-CM | POA: Insufficient documentation

## 2016-05-08 DIAGNOSIS — K640 First degree hemorrhoids: Secondary | ICD-10-CM | POA: Insufficient documentation

## 2016-05-08 DIAGNOSIS — H409 Unspecified glaucoma: Secondary | ICD-10-CM | POA: Diagnosis not present

## 2016-05-08 DIAGNOSIS — D175 Benign lipomatous neoplasm of intra-abdominal organs: Secondary | ICD-10-CM | POA: Diagnosis not present

## 2016-05-08 DIAGNOSIS — K5790 Diverticulosis of intestine, part unspecified, without perforation or abscess without bleeding: Secondary | ICD-10-CM | POA: Diagnosis not present

## 2016-05-08 HISTORY — PX: COLONOSCOPY WITH PROPOFOL: SHX5780

## 2016-05-08 HISTORY — DX: Benign neoplasm of colon, unspecified: D12.6

## 2016-05-08 HISTORY — DX: Unspecified osteoarthritis, unspecified site: M19.90

## 2016-05-08 SURGERY — COLONOSCOPY WITH PROPOFOL
Anesthesia: General

## 2016-05-08 MED ORDER — PROPOFOL 500 MG/50ML IV EMUL
INTRAVENOUS | Status: DC | PRN
  Administered 2016-05-08: 150 ug/kg/min via INTRAVENOUS

## 2016-05-08 MED ORDER — LIDOCAINE 2% (20 MG/ML) 5 ML SYRINGE
INTRAMUSCULAR | Status: DC | PRN
  Administered 2016-05-08: 100 mg via INTRAVENOUS

## 2016-05-08 MED ORDER — SODIUM CHLORIDE 0.9 % IV SOLN
INTRAVENOUS | Status: DC
  Administered 2016-05-08: 12:00:00 via INTRAVENOUS
  Administered 2016-05-08: 1000 mL via INTRAVENOUS

## 2016-05-08 MED ORDER — EPHEDRINE SULFATE 50 MG/ML IJ SOLN
INTRAMUSCULAR | Status: DC | PRN
  Administered 2016-05-08: 5 mg via INTRAVENOUS
  Administered 2016-05-08: 10 mg via INTRAVENOUS
  Administered 2016-05-08: 15 mg via INTRAVENOUS

## 2016-05-08 MED ORDER — SODIUM CHLORIDE 0.9 % IV SOLN
INTRAVENOUS | Status: DC
Start: 2016-05-08 — End: 2016-05-08

## 2016-05-08 MED ORDER — MIDAZOLAM HCL 5 MG/5ML IJ SOLN
INTRAMUSCULAR | Status: DC | PRN
  Administered 2016-05-08: 2 mg via INTRAVENOUS

## 2016-05-08 MED ORDER — PROPOFOL 10 MG/ML IV BOLUS
INTRAVENOUS | Status: DC | PRN
  Administered 2016-05-08: 50 mg via INTRAVENOUS

## 2016-05-08 MED ORDER — FENTANYL CITRATE (PF) 100 MCG/2ML IJ SOLN
INTRAMUSCULAR | Status: DC | PRN
  Administered 2016-05-08: 50 ug via INTRAVENOUS

## 2016-05-08 NOTE — Op Note (Signed)
[facility] Patient Name: Dylan Young Procedure Date: 05/08/2016 11:30 AM MRN: [id number] Account #: [id number] Date of Birth: January 20, 1946 Admit Type: Outpatient Age: 70 Room: [facility] ENDO ROOM 3 Gender: Male Note Status: Finalized Procedure:            Colonoscopy Indications:          Personal history of colonic polyps Providers:            Lollie Sails, MD Referring MD:         Shirline Frees (Referring MD) Medicines:            Monitored Anesthesia Care Complications:        No immediate complications. Procedure:            Pre-Anesthesia Assessment:                       - ASA Grade Assessment: III - A patient with severe                        systemic disease.                       After obtaining informed consent, the colonoscope was                        passed under direct vision. Throughout the procedure,                        the patient's blood pressure, pulse, and oxygen                        saturations were monitored continuously. The                        Colonoscope was introduced through the anus and                        advanced to the the cecum, identified by appendiceal                        orifice and ileocecal valve. The colonoscopy was                        unusually difficult due to a tortuous colon. Successful                        completion of the procedure was aided by changing the                        patient to a supine position, changing the patient to a                        prone position, using manual pressure and withdrawing                        and reinserting the scope. The quality of the bowel                        preparation was fair. Findings:      A 2 mm polyp was found in the  descending colon distal descending colon.       The polyp was sessile. The polyp was removed with a cold biopsy forceps.       Resection and retrieval were complete.      A 4 mm polyp was found in the  proximal transverse colon. The polyp was       sessile. The polyp was removed with a cold biopsy forceps. Resection and       retrieval were complete.      A few small-mouthed diverticula were found in the sigmoid colon,       descending colon and distal transverse colon.      Non-bleeding internal hemorrhoids were found during anoscopy. The       hemorrhoids were small and Grade I (internal hemorrhoids that do not       prolapse).      The digital rectal exam was normal.      There was a medium-sized lipoma, 12 to 18 mm in diameter, in the       proximal transverse colon and in the ascending colon. Positive pillow       sign.      The digital rectal exam was normal. Impression:           - Preparation of the colon was fair.                       - One 2 mm polyp in the descending colon in the distal                        descending colon, removed with a cold biopsy forceps.                        Resected and retrieved.                       - One 4 mm polyp in the proximal transverse colon,                        removed with a cold biopsy forceps. Resected and                        retrieved.                       - Diverticulosis in the sigmoid colon, in the                        descending colon and in the distal transverse colon.                       - Non-bleeding internal hemorrhoids. Recommendation:       - Discharge patient to home.                       - Telephone GI clinic for pathology results in 1 week. Procedure Code(s):    --- Professional ---                       [phone], Colonoscopy, flexible; with biopsy, single or                        multiple Diagnosis Code(s):    ---  Professional ---                       K64.0, First degree hemorrhoids                       D12.4, Benign neoplasm of descending colon                       D12.3, Benign neoplasm of transverse colon (hepatic                        flexure or splenic flexure)                       Z86.010,  Personal history of colonic polyps                       K57.30, Diverticulosis of large intestine without                        perforation or abscess without bleeding CPT copyright 2016 American Medical Association. All rights reserved. The codes documented in this report are preliminary and upon coder review may  be revised to meet current compliance requirements. Lollie Sails, MD 05/08/2016 12:26:23 PM This report has been signed electronically. Number of Addenda: 0 Note Initiated On: 05/08/2016 11:30 AM Scope Withdrawal Time: 0 hours 9 minutes 47 seconds  Total Procedure Duration: 0 hours 38 minutes 47 seconds       [facility]

## 2016-05-08 NOTE — Anesthesia Postprocedure Evaluation (Signed)
Anesthesia Post Note  Patient: Dylan Young  Procedure(s) Performed: Procedure(s) (LRB): COLONOSCOPY WITH PROPOFOL (N/A)  Patient location during evaluation: PACU Anesthesia Type: General Level of consciousness: awake and alert Pain management: pain level controlled Vital Signs Assessment: post-procedure vital signs reviewed and stable Respiratory status: spontaneous breathing, nonlabored ventilation, respiratory function stable and patient connected to nasal cannula oxygen Cardiovascular status: blood pressure returned to baseline and stable Postop Assessment: no signs of nausea or vomiting Anesthetic complications: no    Last Vitals:  Filed Vitals:   05/08/16 1250 05/08/16 1300  BP: 143/82 139/92  Pulse: 64 62  Temp:    Resp: 16 15    Last Pain: There were no vitals filed for this visit.               Molli Barrows

## 2016-05-08 NOTE — Transfer of Care (Signed)
Immediate Anesthesia Transfer of Care Note  Patient: Dylan Young  Procedure(s) Performed: Procedure(s): COLONOSCOPY WITH PROPOFOL (N/A)  Patient Location: PACU  Anesthesia Type:General  Level of Consciousness: sedated  Airway & Oxygen Therapy: Patient Spontanous Breathing and Patient connected to nasal cannula oxygen  Post-op Assessment: Report given to RN and Post -op Vital signs reviewed and stable  Post vital signs: Reviewed and stable  Last Vitals:  Filed Vitals:   05/08/16 1020  BP: 142/78  Pulse: 61  Temp: 35.9 C  Resp: 16    Last Pain: There were no vitals filed for this visit.       Complications: No apparent anesthesia complications

## 2016-05-08 NOTE — H&P (Signed)
Outpatient short stay form Pre-procedure 05/08/2016 11:37 AM Lollie Sails MD  Primary Physician: Dr Sherrin Daisy  Reason for visit:  Colonoscopy  History of present illness:  Patient is a 70 year old male presenting today for colonoscopy. He has a personal history of adenomatous colon polyps. He tolerated his prep well. He is held his aspirin product for about a week. He takes no other aspirin with the exception of a occasional 325 mg. Aches no other blood thinning agents.    Current facility-administered medications:  .  0.9 %  sodium chloride infusion, , Intravenous, Continuous, Lollie Sails, MD, Last Rate: 20 mL/hr at 05/08/16 1036, 1,000 mL at 05/08/16 1036 .  0.9 %  sodium chloride infusion, , Intravenous, Continuous, Lollie Sails, MD  Prescriptions prior to admission  Medication Sig Dispense Refill Last Dose  . bromfenac (XIBROM) 0.09 % ophthalmic solution Apply to eye.   Past Week at Unknown time  . Cholecalciferol (VITAMIN D-3) 1000 UNITS CAPS Take 1 capsule by mouth daily.   Past Week at Unknown time  . Dorzolamide HCl-Timolol Mal PF 22.3-6.8 MG/ML SOLN Place 1 drop into both eyes 2 (two) times daily.   Past Week at Unknown time  . doxazosin (CARDURA) 4 MG tablet Take 4 mg by mouth daily.   Past Week at Unknown time  . dutasteride (AVODART) 0.5 MG capsule Take 0.5 mg by mouth daily.   Past Week at Unknown time  . latanoprost (XALATAN) 0.005 % ophthalmic solution Apply to eye.   05/08/2016 at 0500  . Multiple Vitamins-Minerals (MULTIVITAMIN ADULT PO) Take 1 tablet by mouth daily.   Past Week at Unknown time  . Vit B6-Vit B12-Omega 3 Acids (VITAMIN B PLUS+ PO) Take 1 tablet by mouth daily.   Past Week at Unknown time  . vitamin C (ASCORBIC ACID) 500 MG tablet Take 500 mg by mouth 4 (four) times daily.   Past Week at Unknown time  . vitamin E 400 UNIT capsule Take 400 Units by mouth daily.   Past Week at Unknown time     No Known Allergies   Past Medical History   Diagnosis Date  . Glaucoma   . Kidney stones   . Arthritis   . Hyperlipidemia   . Dysphagia   . Vitamin D deficiency   . Benign neoplasm of colon   . Heart burn   . History of stomach ulcers   . Obesity   . Benign prostatic hypertrophy with lower urinary tract symptoms (LUTS)   . ED (erectile dysfunction)   . Urinary frequency   . Urinary urgency   . Hypogonadism male   . Urinary frequency   . Osteoarthritis   . Tubular adenoma of colon     Review of systems:      Physical Exam    Heart and lungs: Regular rate and rhythm without rub or gallop, lungs are bilaterally clear.    HEENT: Normocephalic atraumatic eyes are anicteric    Other:     Pertinant exam for procedure: Soft nontender nondistended bowel sounds positive normoactive.    Planned proceedures: Colonoscopy and indicated procedures. I have discussed the risks benefits and complications of procedures to include not limited to bleeding, infection, perforation and the risk of sedation and the patient wishes to proceed.    Lollie Sails, MD Gastroenterology 05/08/2016  11:37 AM

## 2016-05-08 NOTE — Anesthesia Preprocedure Evaluation (Signed)
Anesthesia Evaluation  Patient identified by MRN, date of birth, ID band Patient awake    Reviewed: Allergy & Precautions, H&P , NPO status , Patient's Chart, lab work & pertinent test results, reviewed documented beta blocker date and time   Airway Mallampati: II   Neck ROM: full    Dental  (+) Poor Dentition, Teeth Intact   Pulmonary neg pulmonary ROS, former smoker,    Pulmonary exam normal        Cardiovascular negative cardio ROS Normal cardiovascular exam Rate:Normal     Neuro/Psych negative neurological ROS  negative psych ROS   GI/Hepatic negative GI ROS, Neg liver ROS,   Endo/Other  negative endocrine ROS  Renal/GU Renal diseasenegative Renal ROS  negative genitourinary   Musculoskeletal   Abdominal   Peds  Hematology negative hematology ROS (+)   Anesthesia Other Findings Past Medical History:   Glaucoma                                                     Kidney stones                                                Arthritis                                                    Hyperlipidemia                                               Dysphagia                                                    Vitamin D deficiency                                         Benign neoplasm of colon                                     Heart burn                                                   History of stomach ulcers                                    Obesity  Benign prostatic hypertrophy with lower urinar*              ED (erectile dysfunction)                                    Urinary frequency                                            Urinary urgency                                              Hypogonadism male                                            Urinary frequency                                            Osteoarthritis                                                Tubular adenoma of colon                                   Past Surgical History:   EYE SURGERY                                                   LITHOTRIPSY                                                   APPENDECTOMY                                                  EXTRACORPOREAL SHOCK WAVE LITHOTRIPSY           Right 05/27/2015       Comment:Procedure: EXTRACORPOREAL SHOCK WAVE               LITHOTRIPSY (ESWL);  Surgeon: Hollice Espy,               MD;  Location: [facility] ORS;  Service: Urology;                Laterality: Right;   RETINAL DETACHMENT SURGERY  CATARACT EXTRACTION                                             Comment:Right (1996); Left (2007) BMI    Body Mass Index   25.52 kg/m 2     Reproductive/Obstetrics                             Anesthesia Physical Anesthesia Plan  ASA: III  Anesthesia Plan: General   Post-op Pain Management:    Induction:   Airway Management Planned:   Additional Equipment:   Intra-op Plan:   Post-operative Plan:   Informed Consent: I have reviewed the patients History and Physical, chart, labs and discussed the procedure including the risks, benefits and alternatives for the proposed anesthesia with the patient or authorized representative who has indicated his/her understanding and acceptance.   Dental Advisory Given  Plan Discussed with: CRNA  Anesthesia Plan Comments:         Anesthesia Quick Evaluation

## 2016-05-09 LAB — SURGICAL PATHOLOGY

## 2016-05-10 ENCOUNTER — Encounter: Payer: Self-pay | Admitting: Gastroenterology

## 2016-06-30 DIAGNOSIS — H401133 Primary open-angle glaucoma, bilateral, severe stage: Secondary | ICD-10-CM | POA: Diagnosis not present

## 2016-09-07 DIAGNOSIS — Z0000 Encounter for general adult medical examination without abnormal findings: Secondary | ICD-10-CM | POA: Diagnosis not present

## 2016-09-07 DIAGNOSIS — Z125 Encounter for screening for malignant neoplasm of prostate: Secondary | ICD-10-CM | POA: Diagnosis not present

## 2016-10-17 DIAGNOSIS — H401133 Primary open-angle glaucoma, bilateral, severe stage: Secondary | ICD-10-CM | POA: Diagnosis not present

## 2017-01-23 DIAGNOSIS — H401133 Primary open-angle glaucoma, bilateral, severe stage: Secondary | ICD-10-CM | POA: Diagnosis not present

## 2017-02-20 DIAGNOSIS — R030 Elevated blood-pressure reading, without diagnosis of hypertension: Secondary | ICD-10-CM | POA: Diagnosis not present

## 2017-03-29 IMAGING — CT CT RENAL STONE PROTOCOL
2 of 4 series · 16 of 46 positions shown, 18 images · non-contrast
Comparison: None.

CLINICAL DATA: Right flank pain for 3 hours. History of kidney
stones.

EXAM:
CT ABDOMEN AND PELVIS WITHOUT CONTRAST
TECHNIQUE: Multidetector CT imaging of the abdomen and pelvis was performed
following the standard protocol without IV contrast.

[Series 2: stone standard full · axial · 0.72mm/px · z∈[-455,-65]mm · 13 of 86 slices shown, 15 images]
[im 4/86  soft-tissue]
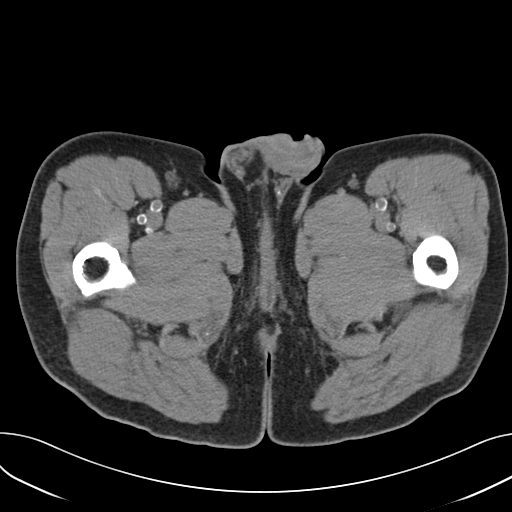
[im 4/86  bone]
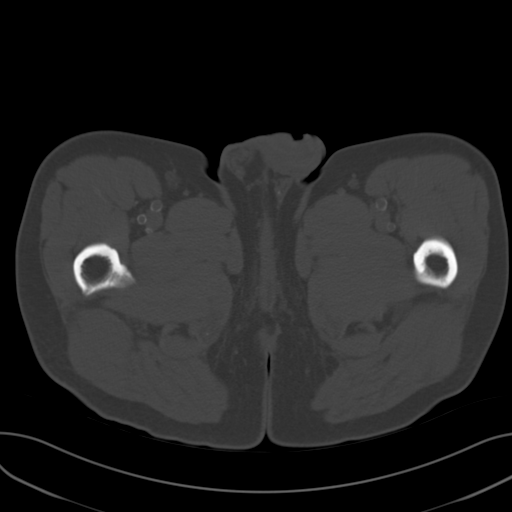
[im 11/86  soft-tissue]
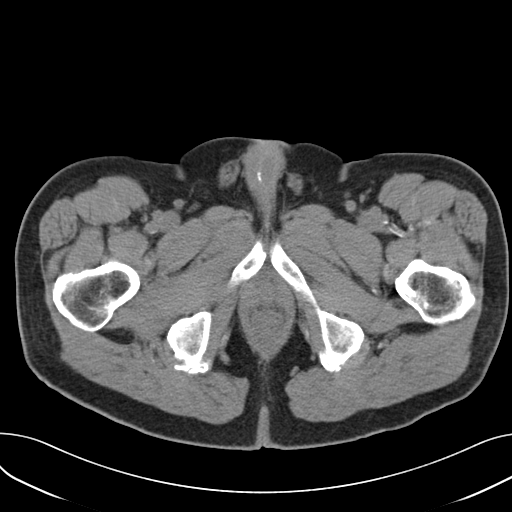
[im 18/86  soft-tissue]
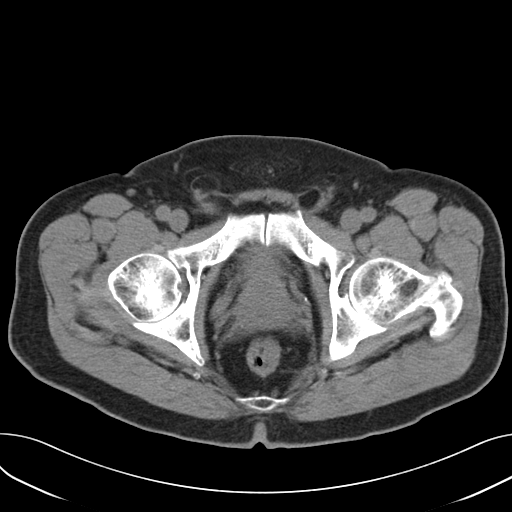
[im 25/86  soft-tissue]
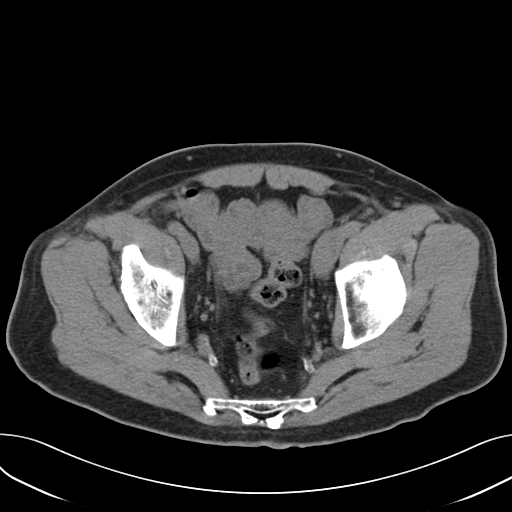
[im 29/86  soft-tissue]
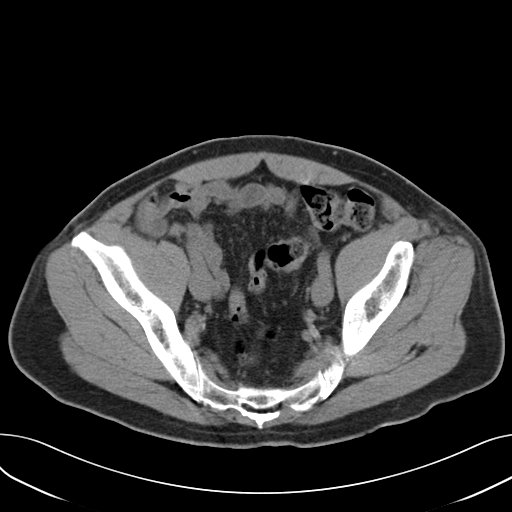
[im 36/86  soft-tissue]
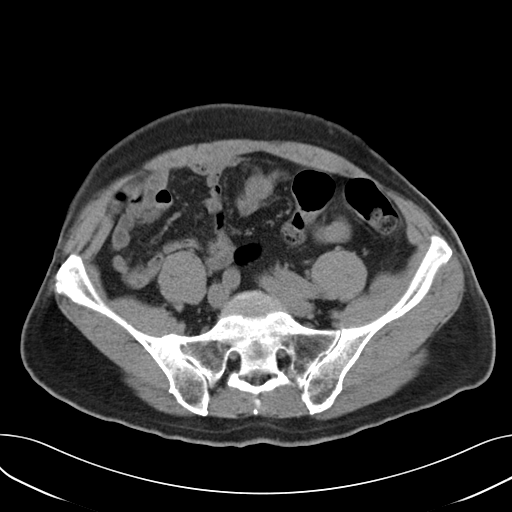
[im 43/86  soft-tissue]
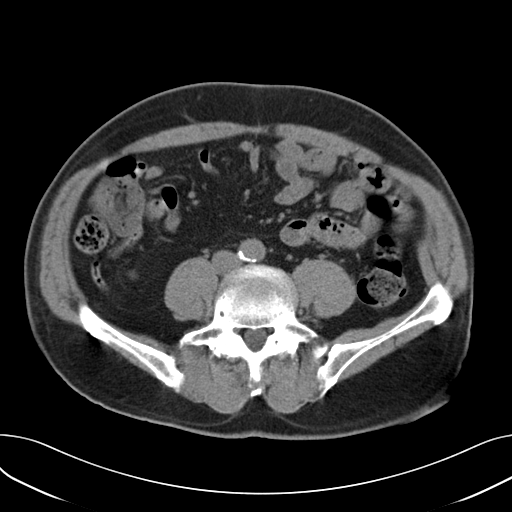
[im 50/86  soft-tissue]
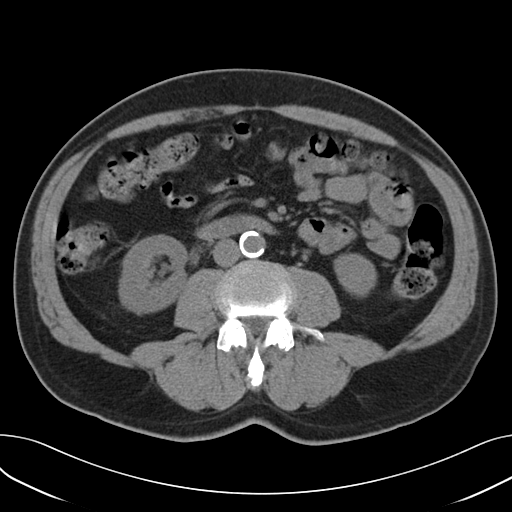
[im 57/86  soft-tissue]
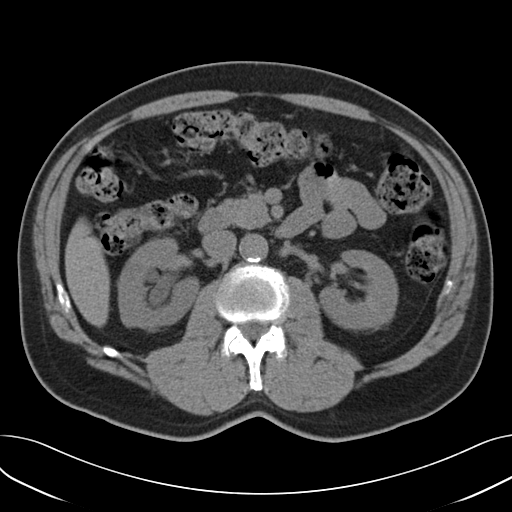
[im 57/86  bone]
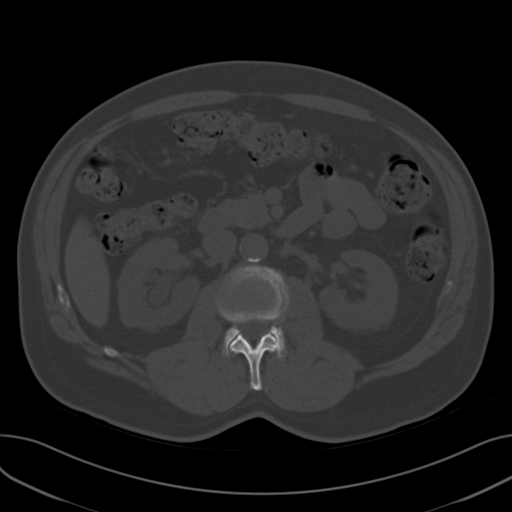
[im 61/86  soft-tissue]
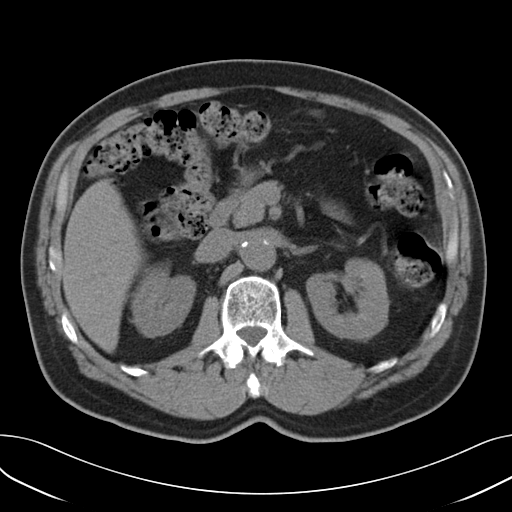
[im 68/86  soft-tissue]
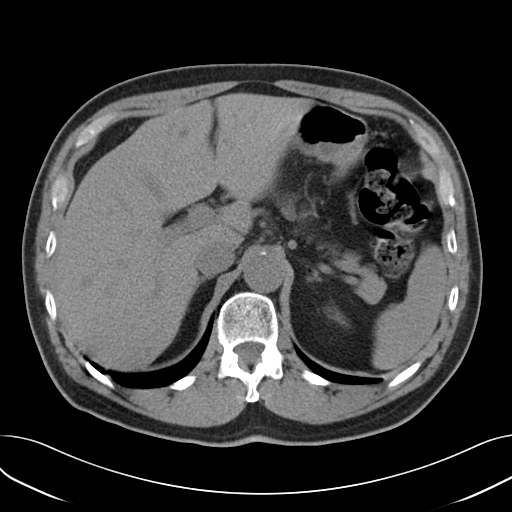
[im 75/86  soft-tissue]
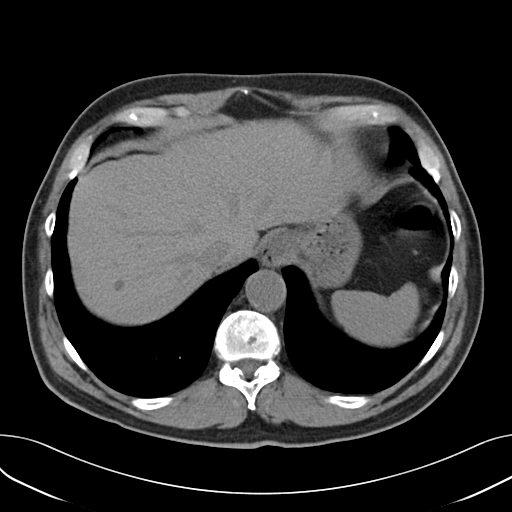
[im 82/86  soft-tissue]
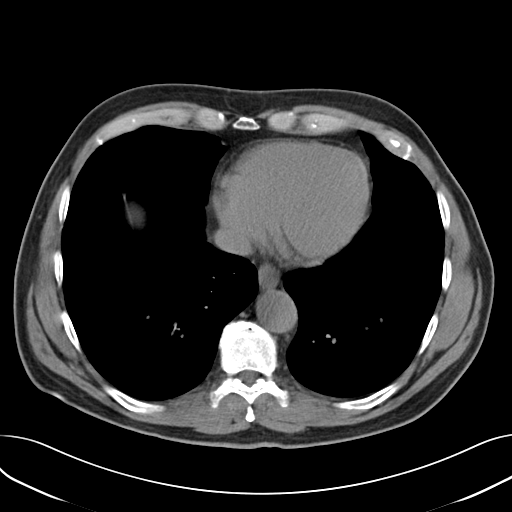

[Series 5: cor stone standard full · coronal · 0.92mm/px · 3 of 144 slices shown]
[im 48/144  soft-tissue]
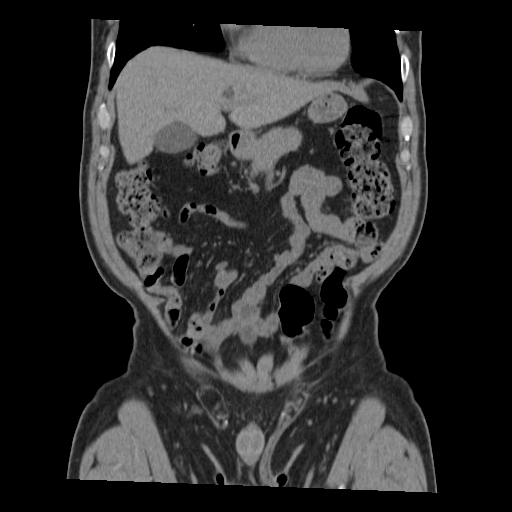
[im 64/144  soft-tissue]
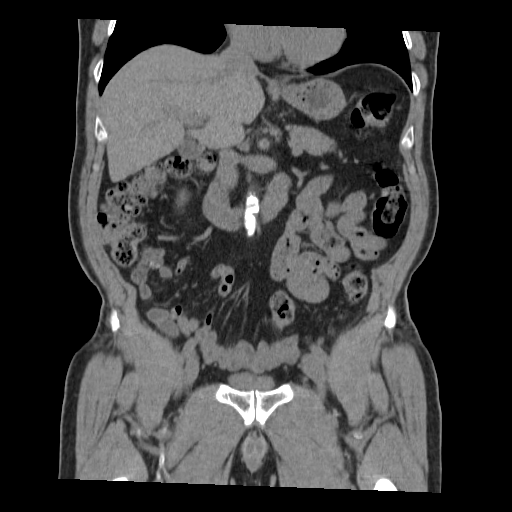
[im 80/144  soft-tissue]
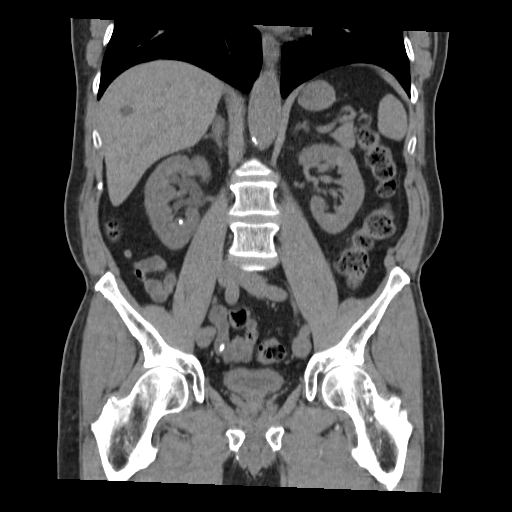

[16 of 46 positions shown; findings below may reference images not displayed]

FINDINGS: The included lung bases are clear. Minimal subpleural atelectasis in
the middle lobe.

There is an obstructing 10 mm stone in the right proximal ureter
just distal to the ureteropelvic junction with resultant mild
hydronephrosis and perinephric stranding. The distal ureter is
decompressed. An additional nonobstructing 3 mm stone is seen in the
lower right kidney. No left renal stones or obstructive uropathy.

There are multiple scattered hepatic hypodensities throughout both
lobes largest in the right hepatic lobe measuring 1.5 cm and
measuring simple fluid density. Many of these are too small to
accurately characterize. Gallbladder is physiologically distended.
The spleen and adrenal glands are normal. The pancreas is normal.

Small hiatal hernia. Stomach is physiologically distended. There are
no dilated or thickened bowel loops. Linear density within pelvic
bowel loops is likely related to ingested material/tail. The
appendix is normal. Small to moderate volume of colonic stool
without colonic wall thickening. Mild diverticulosis of the distal
colon without diverticulitis. No free air, free fluid, or
intra-abdominal fluid collection.

No retroperitoneal adenopathy. Abdominal aorta is normal in caliber.
There is moderate atherosclerotic calcification.

Within the pelvis the bladder is near completely decompressed.
Prostate gland is normal in size. There is fat within the right
inguinal canal. No pelvic adenopathy or free fluid.

There are no acute or suspicious osseous abnormalities. Degenerative
change in the lower lumbar spine, with primarily facet arthropathy.
IMPRESSION: 1. Obstructing 10 mm stone in the right proximal ureter just distal
to the ureteropelvic junction with resultant mild hydronephrosis.
Additional nonobstructing stone in the lower right kidney.
2. Multiple scattered small hepatic hypodensities, likely a cysts or
biliary hamartomas.
3. Incidental findings of small hiatal hernia and atherosclerosis.

## 2017-03-30 IMAGING — CR DG ABDOMEN 1V
1 series · 1 of 1 positions shown · non-contrast
Comparison: 05/26/2015

CLINICAL DATA: Right ureteral stone

EXAM:
ABDOMEN - 1 VIEW

[dg abd 1 view]
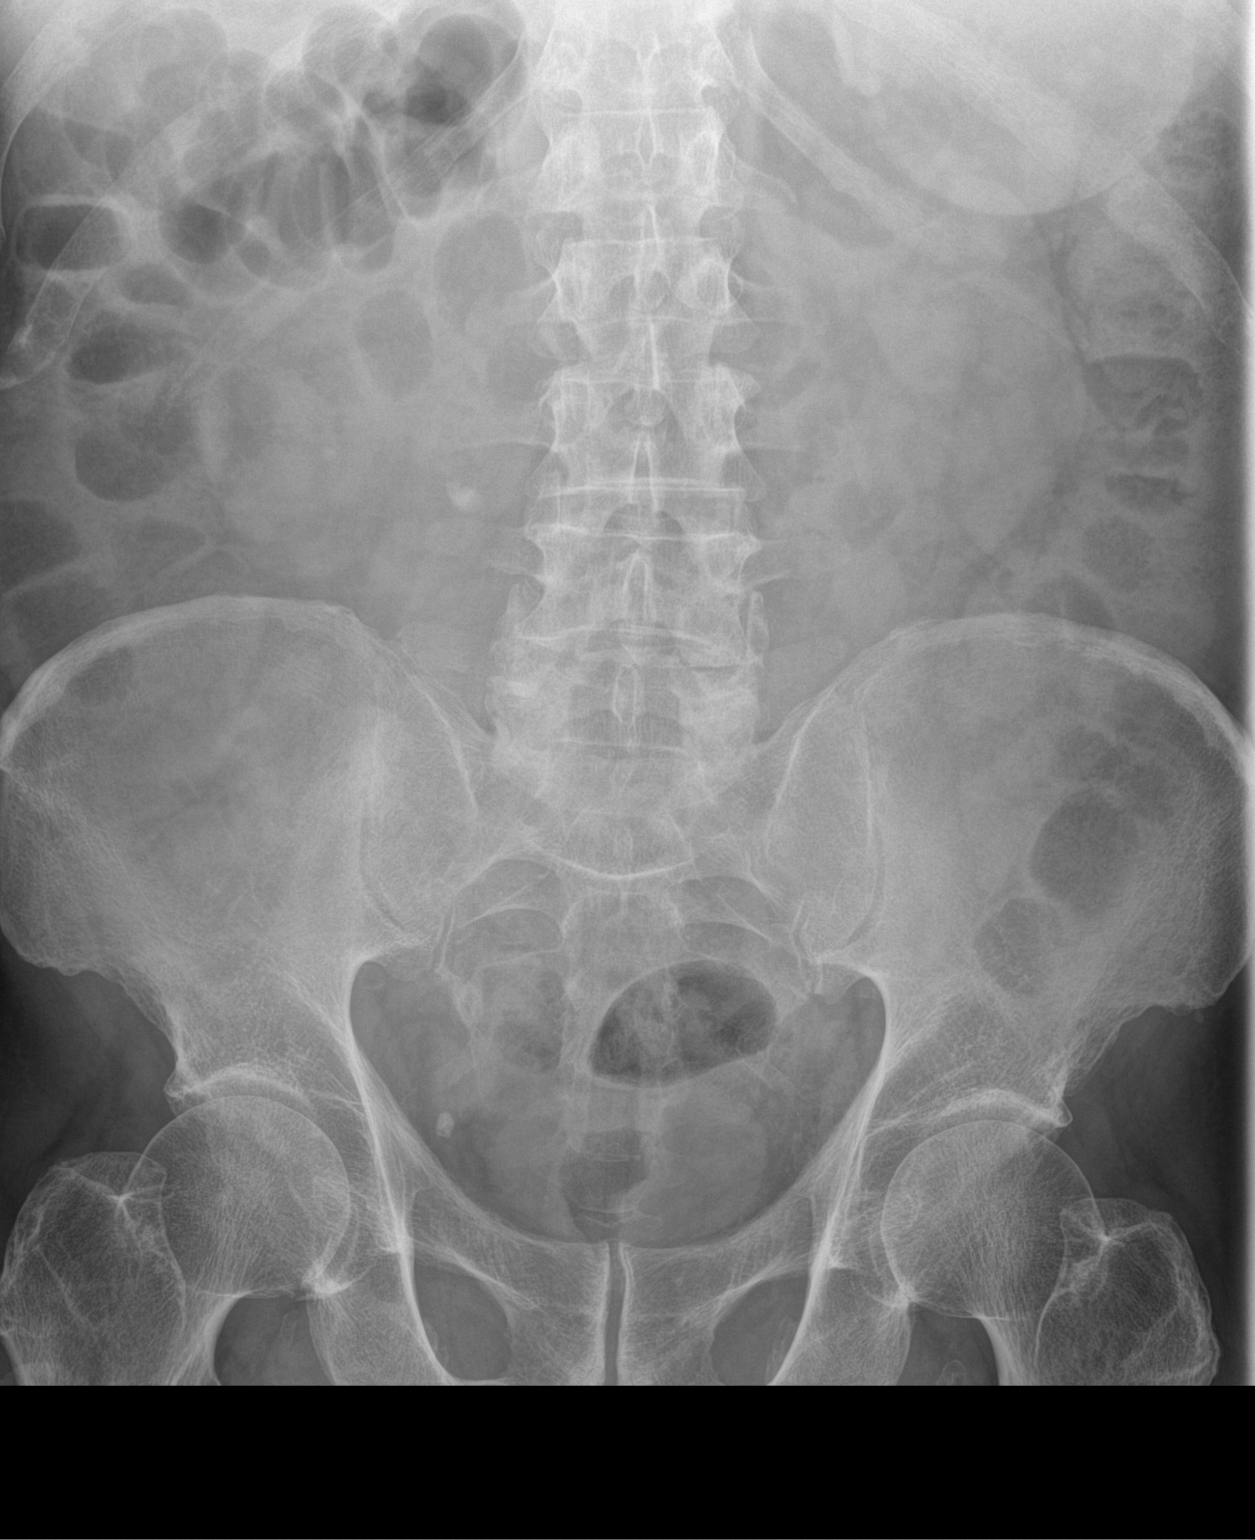

[1 of 1 positions shown; findings below may reference images not displayed]

FINDINGS: Scattered large and small bowel gas is noted. Nonobstructing right
renal stone is again seen in the lower pole. The right proximal
ureteral stone is again noted stable from the previous exam. No
significant migration is seen. No acute bony abnormality is noted.
IMPRESSION: Right renal and right ureteral calculi stable from the previous
exam.

## 2017-04-13 ENCOUNTER — Ambulatory Visit
Admission: EM | Admit: 2017-04-13 | Discharge: 2017-04-13 | Disposition: A | Discharge status: Home / Self Care | Payer: PPO | Attending: Family Medicine | Admitting: Family Medicine

## 2017-04-13 ENCOUNTER — Encounter: Payer: Self-pay | Admitting: *Deleted

## 2017-04-13 DIAGNOSIS — J0100 Acute maxillary sinusitis, unspecified: Secondary | ICD-10-CM

## 2017-04-13 DIAGNOSIS — R0981 Nasal congestion: Secondary | ICD-10-CM | POA: Diagnosis not present

## 2017-04-13 MED ORDER — AMOXICILLIN 875 MG PO TABS: 875.0000 mg | ORAL_TABLET | Freq: Two times a day (BID) | ORAL | 0 refills | Status: DC

## 2017-04-13 NOTE — ED Provider Notes (Signed)
[facility]    CSN: [id number] Arrival date & time: 04/13/17  1152     History   Chief Complaint Chief Complaint  Patient presents with  . Facial Pain  . Nasal Congestion  . Burning Eyes    HPI Dylan Young is a 71 y.o. male.   The history is provided by the patient.  URI  Presenting symptoms: congestion, facial pain, fatigue and fever   Severity:  Moderate Onset quality:  Sudden Duration:  1 week Timing:  Constant Progression:  Worsening Chronicity:  New Relieved by:  Nothing Ineffective treatments:  OTC medications Associated symptoms: sinus pain   Associated symptoms: no wheezing   Risk factors: not elderly, no chronic cardiac disease and no chronic kidney disease     Past Medical History:  Diagnosis Date  . Arthritis   . Benign neoplasm of colon   . Benign prostatic hypertrophy with lower urinary tract symptoms (LUTS)   . Dysphagia   . ED (erectile dysfunction)   . Glaucoma   . Heart burn   . History of stomach ulcers   . Hyperlipidemia   . Hypogonadism male   . Kidney stones   . Obesity   . Osteoarthritis   . Tubular adenoma of colon   . Urinary frequency   . Urinary frequency   . Urinary urgency   . Vitamin D deficiency     Patient Active Problem List   Diagnosis Date Noted  . Glaucoma 05/26/2015  . Arthritis, degenerative 05/26/2015  . Tubular adenoma of colon 05/26/2015  . Benign prostatic hyperplasia with urinary obstruction 03/08/2015    Past Surgical History:  Procedure Laterality Date  . APPENDECTOMY    . CATARACT EXTRACTION     Right (1996); Left (2007)  . COLONOSCOPY WITH PROPOFOL N/A 05/08/2016   Procedure: COLONOSCOPY WITH PROPOFOL;  Surgeon: Lollie Sails, MD;  Location: [facility] ENDOSCOPY;  Service: Endoscopy;  Laterality: N/A;  . EXTRACORPOREAL SHOCK WAVE LITHOTRIPSY Right 05/27/2015   Procedure: EXTRACORPOREAL SHOCK WAVE LITHOTRIPSY (ESWL);  Surgeon: Hollice Espy, MD;  Location: [facility] ORS;  Service:  Urology;  Laterality: Right;  . EYE SURGERY    . LITHOTRIPSY    . RETINAL DETACHMENT SURGERY         Home Medications    Prior to Admission medications   Medication Sig Start Date End Date Taking? Authorizing Provider  bromfenac (XIBROM) 0.09 % ophthalmic solution Apply to eye.   Yes Historical Provider, MD  Cholecalciferol (VITAMIN D-3) 1000 UNITS CAPS Take 1 capsule by mouth daily.   Yes Historical Provider, MD  Dorzolamide HCl-Timolol Mal PF 22.3-6.8 MG/ML SOLN Place 1 drop into both eyes 2 (two) times daily.   Yes Historical Provider, MD  latanoprost (XALATAN) 0.005 % ophthalmic solution Apply to eye. 01/10/08  Yes Historical Provider, MD  Multiple Vitamins-Minerals (MULTIVITAMIN ADULT PO) Take 1 tablet by mouth daily.   Yes Historical Provider, MD  Vit B6-Vit B12-Omega 3 Acids (VITAMIN B PLUS+ PO) Take 1 tablet by mouth daily.   Yes Historical Provider, MD  vitamin C (ASCORBIC ACID) 500 MG tablet Take 500 mg by mouth 4 (four) times daily.   Yes Historical Provider, MD  vitamin E 400 UNIT capsule Take 400 Units by mouth daily.   Yes Historical Provider, MD  amoxicillin (AMOXIL) 875 MG tablet Take 1 tablet (875 mg total) by mouth 2 (two) times daily. 04/13/17   Norval Gable, MD  doxazosin (CARDURA) 4 MG tablet Take 4 mg by mouth daily.  Historical Provider, MD  dutasteride (AVODART) 0.5 MG capsule Take 0.5 mg by mouth daily.    Historical Provider, MD    Family History Family History  Problem Relation Age of Onset  . Prostate cancer Father   . Alzheimer's disease Father   . Diabetes Mother   . Stroke Mother     Social History Social History  Substance Use Topics  . Smoking status: Former Research scientist (life sciences)  . Smokeless tobacco: Former Systems developer    Quit date: 05/26/1975  . Alcohol use 0.6 oz/week    1 Standard drinks or equivalent per week     Comment: occasional      Allergies   Patient has no known allergies.   Review of Systems Review of Systems  Constitutional: Positive for  fatigue and fever.  HENT: Positive for congestion and sinus pain.   Respiratory: Negative for wheezing.      Physical Exam Triage Vital Signs ED Triage Vitals  Enc Vitals Group     BP 04/13/17 1214 (!) 146/95     Pulse Rate 04/13/17 1214 65     Resp 04/13/17 1214 16     Temp 04/13/17 1214 97.6 F (36.4 C)     Temp Source 04/13/17 1214 Oral     SpO2 04/13/17 1214 100 %     Weight 04/13/17 1216 160 lb (72.6 kg)     Height 04/13/17 1216 5\' 6"  (1.676 m)     Head Circumference --      Peak Flow --      Pain Score --      Pain Loc --      Pain Edu? --      Excl. in [place]? --    No data found.   Updated Vital Signs BP (!) 146/95 (BP Location: Left Arm)   Pulse 65   Temp 97.6 F (36.4 C) (Oral)   Resp 16   Ht 5\' 6"  (1.676 m)   Wt 160 lb (72.6 kg)   SpO2 100%   BMI 25.82 kg/m   Visual Acuity Right Eye Distance:   Left Eye Distance:   Bilateral Distance:    Right Eye Near:   Left Eye Near:    Bilateral Near:     Physical Exam  Constitutional: He appears well-developed and well-nourished. No distress.  HENT:  Head: Normocephalic and atraumatic.  Right Ear: Tympanic membrane, external ear and ear canal normal.  Left Ear: Tympanic membrane, external ear and ear canal normal.  Nose: Right sinus exhibits maxillary sinus tenderness and frontal sinus tenderness. Left sinus exhibits maxillary sinus tenderness and frontal sinus tenderness.  Mouth/Throat: Uvula is midline, oropharynx is clear and moist and mucous membranes are normal. No oropharyngeal exudate or tonsillar abscesses.  Eyes: Conjunctivae and EOM are normal. Pupils are equal, round, and reactive to light. Right eye exhibits no discharge. Left eye exhibits no discharge. No scleral icterus.  Neck: Normal range of motion. Neck supple. No tracheal deviation present. No thyromegaly present.  Cardiovascular: Normal rate, regular rhythm and normal heart sounds.   Pulmonary/Chest: Effort normal and breath sounds normal. No  stridor. No respiratory distress. He has no wheezes. He has no rales. He exhibits no tenderness.  Lymphadenopathy:    He has no cervical adenopathy.  Neurological: He is alert.  Skin: Skin is warm and dry. No rash noted. He is not diaphoretic.  Nursing note and vitals reviewed.    UC Treatments / Results  Labs (all labs ordered are listed, but only abnormal  results are displayed) Labs Reviewed - No data to display  EKG  EKG Interpretation None       Radiology No results found.  Procedures Procedures (including critical care time)  Medications Ordered in UC Medications - No data to display   Initial Impression / Assessment and Plan / UC Course  I have reviewed the triage vital signs and the nursing notes.  Pertinent labs & imaging results that were available during my care of the patient were reviewed by me and considered in my medical decision making (see chart for details).       Final Clinical Impressions(s) / UC Diagnoses   Final diagnoses:  Acute maxillary sinusitis, recurrence not specified    New Prescriptions Discharge Medication List as of 04/13/2017 12:46 PM    START taking these medications   Details  amoxicillin (AMOXIL) 875 MG tablet Take 1 tablet (875 mg total) by mouth 2 (two) times daily., Starting Fri 04/13/2017, Normal       1. diagnosis reviewed with patient 2. rx as per orders above; reviewed possible side effects, interactions, risks and benefits  3. Recommend supportive treatment with otc flonase 4. Follow-up prn if symptoms worsen or don't improve   Norval Gable, MD 04/13/17 1546

## 2017-04-13 NOTE — ED Triage Notes (Signed)
Burning irritated eyes, facial pain, runny nose, x 3-5 days. OTC meds not working.

## 2017-04-14 IMAGING — CR DG ABDOMEN 1V
1 series · 2 of 2 positions shown · non-contrast
Comparison: May 27, 2015

CLINICAL DATA: Recent lithotripsy

EXAM:
ABDOMEN - 1 VIEW

[Series 1: dg abd 1 view · 0.14mm/px · 2 of 2 slices shown]
[im 1/2]
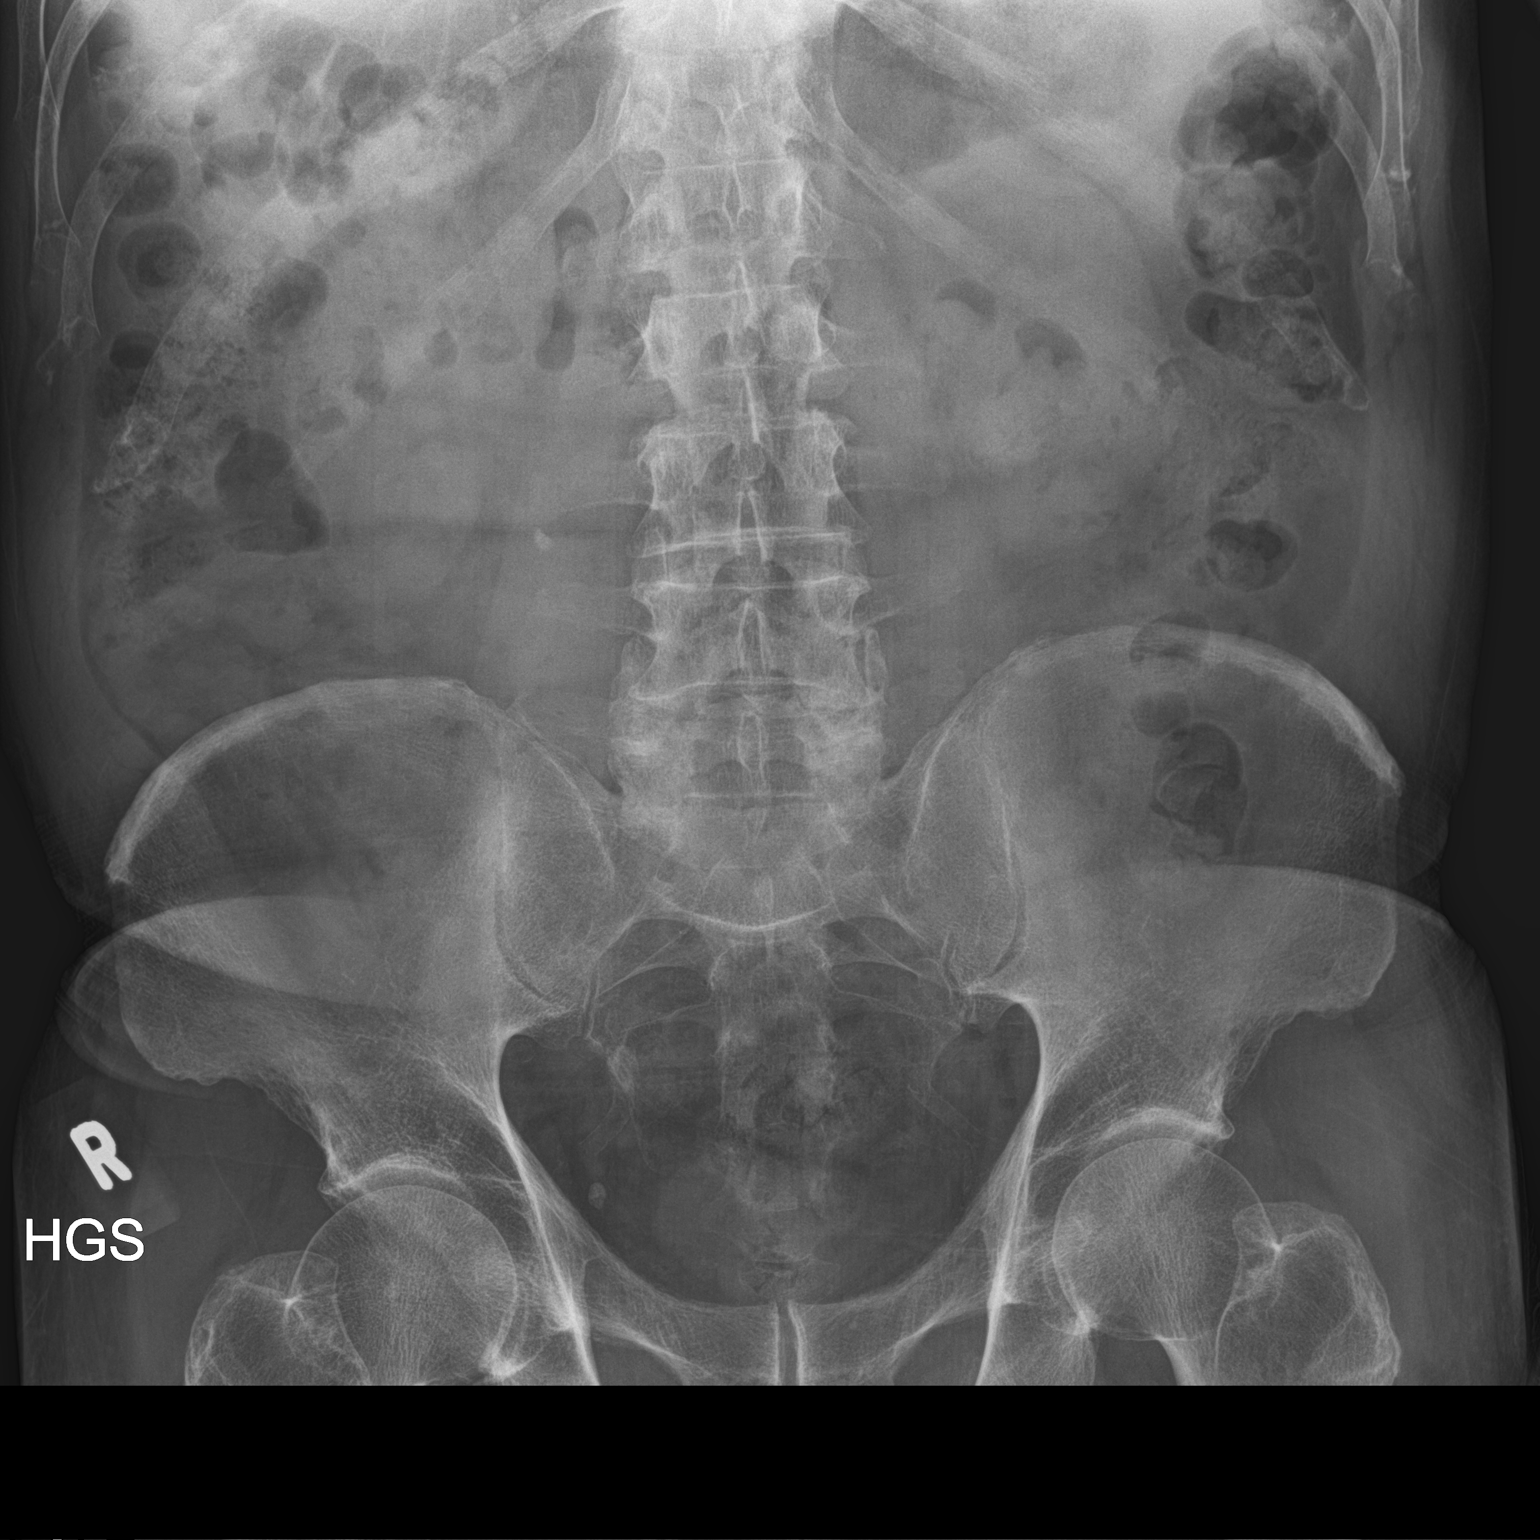
[im 2/2]
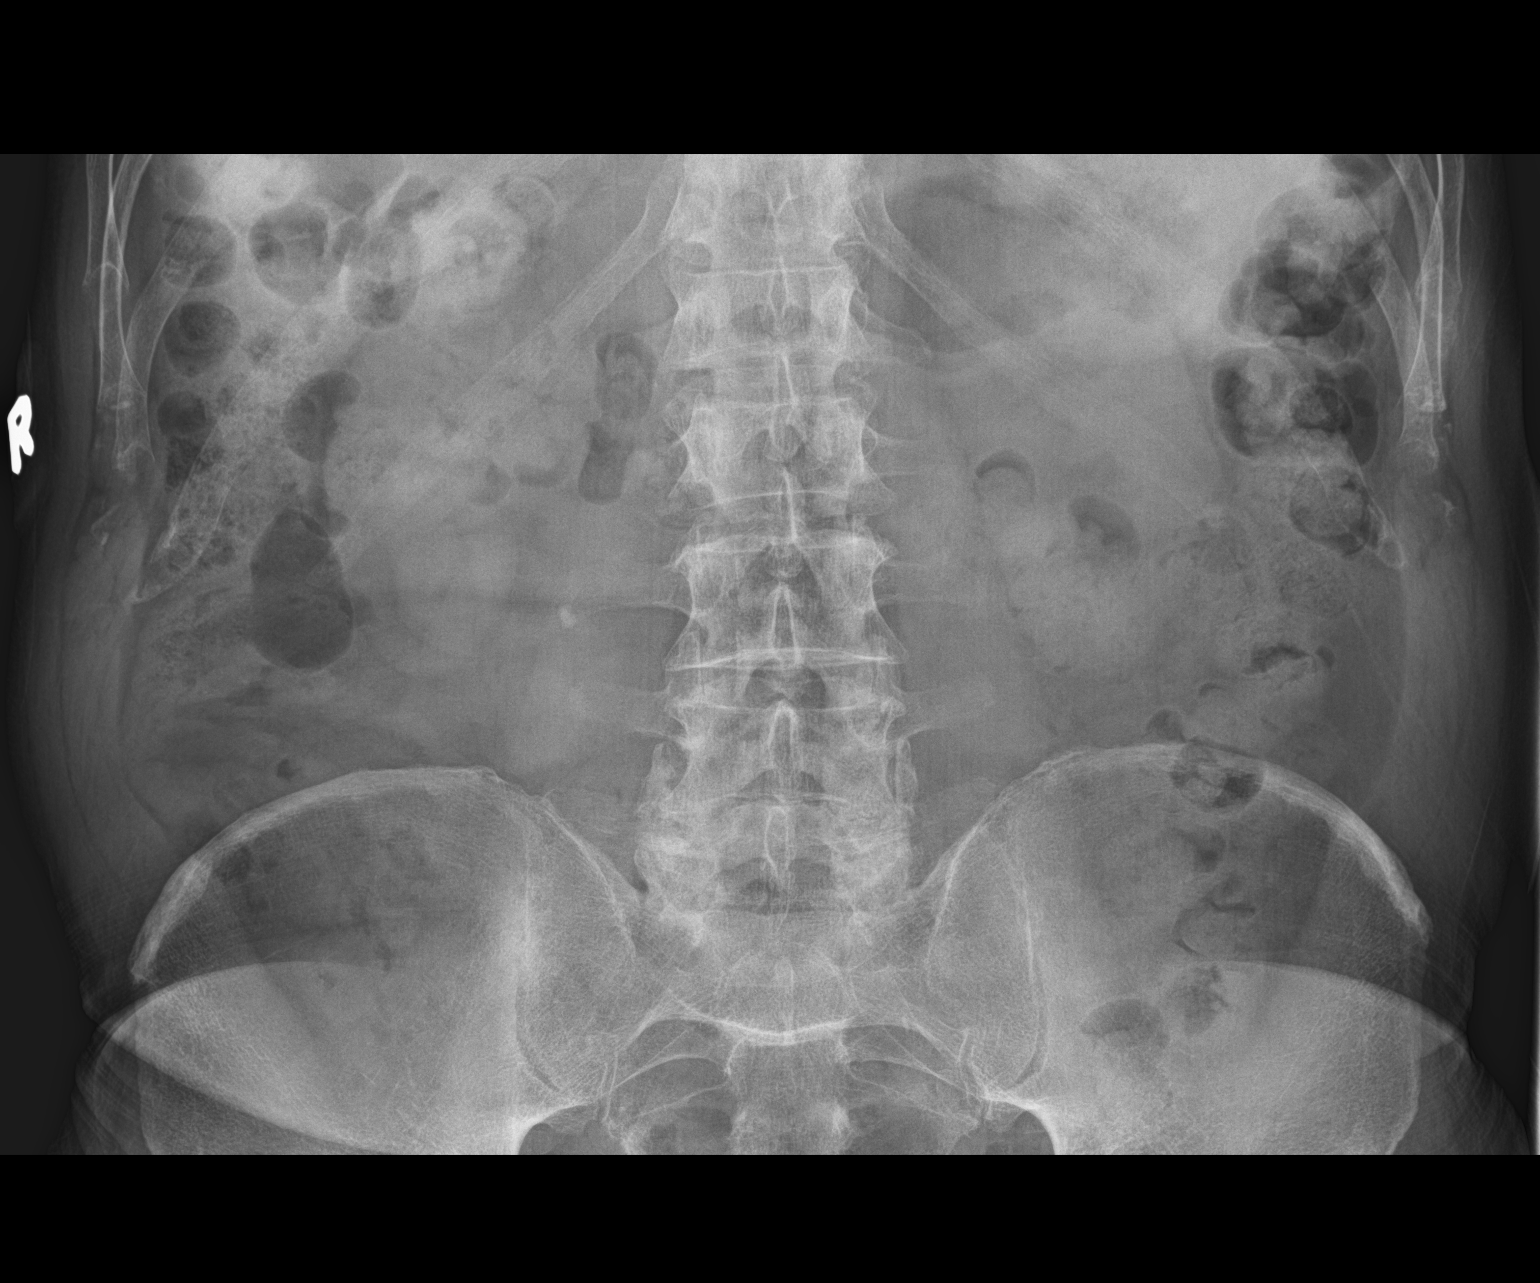

[2 of 2 positions shown; findings below may reference images not displayed]

FINDINGS: The previous small calculus in the lower pole of the right kidney is
no longer appreciable. There is a focal calcification at the level
of L3-4 on the right measuring 6 x 4 mm, felt to represent a small
ureteral calculus. The current calcification is significantly
smaller in this area than on the prior study. There is a stable
phlebolith in the right pelvis. There is moderate stool in the
colon. The bowel gas pattern is normal.
IMPRESSION: Small presumed residual calculus in the right ureter the level of
L3-4, smaller than on the prior study. Previously noted small
calcification in the lower pole right kidney is not seen at this
time. No new calcifications identified. Bowel gas pattern
unremarkable.

## 2017-04-16 DIAGNOSIS — H43811 Vitreous degeneration, right eye: Secondary | ICD-10-CM | POA: Diagnosis not present

## 2017-05-04 DIAGNOSIS — H401133 Primary open-angle glaucoma, bilateral, severe stage: Secondary | ICD-10-CM | POA: Diagnosis not present

## 2017-05-12 IMAGING — CR DG ABDOMEN 1V
1 series · 1 of 1 positions shown · non-contrast
Comparison: None.

CLINICAL DATA: Right-sided stone.

EXAM:
ABDOMEN - 1 VIEW

[dg abd 1 view]
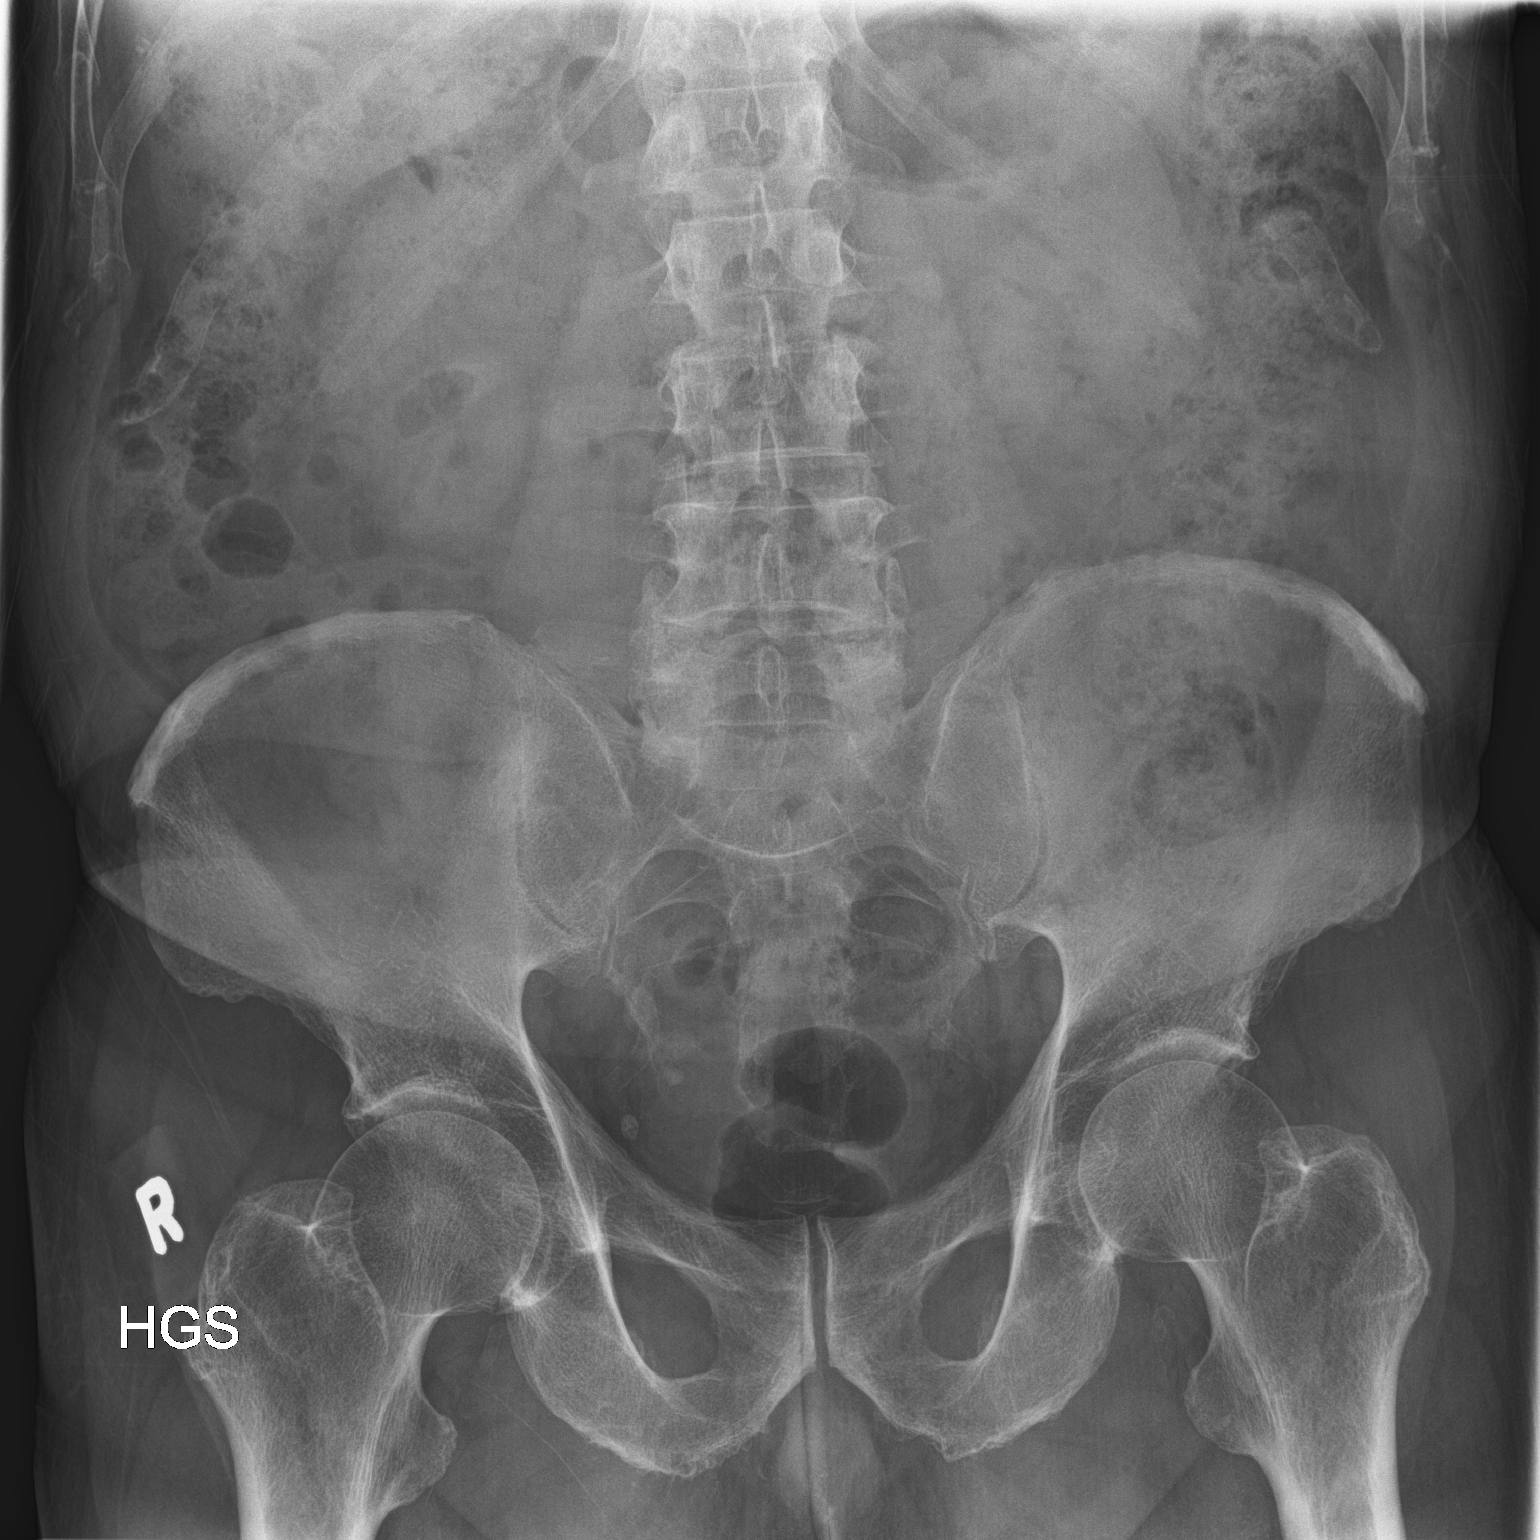

[1 of 1 positions shown; findings below may reference images not displayed]

FINDINGS: Soft tissue structures are unremarkable. No bowel distention.
Previously identified right mid ureteral calcific density is now in
the right lower pelvis, most likely in the distal right ureter or
bladder. a Degenerative changes lumbar spine and both hips.
IMPRESSION: Previously identified right mid ureteral calculus is now projected
over the right lower pelvis, most likely in the distal right ureter
or bladder.

## 2017-08-20 DIAGNOSIS — H401133 Primary open-angle glaucoma, bilateral, severe stage: Secondary | ICD-10-CM | POA: Diagnosis not present

## 2017-08-22 DIAGNOSIS — R030 Elevated blood-pressure reading, without diagnosis of hypertension: Secondary | ICD-10-CM | POA: Diagnosis not present

## 2017-08-22 DIAGNOSIS — Z79899 Other long term (current) drug therapy: Secondary | ICD-10-CM | POA: Diagnosis not present

## 2017-08-22 DIAGNOSIS — N401 Enlarged prostate with lower urinary tract symptoms: Secondary | ICD-10-CM | POA: Diagnosis not present

## 2017-08-22 DIAGNOSIS — R350 Frequency of micturition: Secondary | ICD-10-CM | POA: Diagnosis not present

## 2017-08-22 DIAGNOSIS — Z1159 Encounter for screening for other viral diseases: Secondary | ICD-10-CM | POA: Diagnosis not present

## 2017-08-22 DIAGNOSIS — Z13220 Encounter for screening for lipoid disorders: Secondary | ICD-10-CM | POA: Diagnosis not present

## 2017-08-22 DIAGNOSIS — Z87442 Personal history of urinary calculi: Secondary | ICD-10-CM | POA: Diagnosis not present

## 2017-12-20 DIAGNOSIS — H401133 Primary open-angle glaucoma, bilateral, severe stage: Secondary | ICD-10-CM | POA: Diagnosis not present

## 2017-12-28 DIAGNOSIS — H401133 Primary open-angle glaucoma, bilateral, severe stage: Secondary | ICD-10-CM | POA: Diagnosis not present

## 2018-03-06 DIAGNOSIS — Z79899 Other long term (current) drug therapy: Secondary | ICD-10-CM | POA: Diagnosis not present

## 2018-03-06 DIAGNOSIS — M170 Bilateral primary osteoarthritis of knee: Secondary | ICD-10-CM | POA: Diagnosis not present

## 2018-03-06 DIAGNOSIS — Z6827 Body mass index (BMI) 27.0-27.9, adult: Secondary | ICD-10-CM | POA: Diagnosis not present

## 2018-03-06 DIAGNOSIS — E7800 Pure hypercholesterolemia, unspecified: Secondary | ICD-10-CM | POA: Diagnosis not present

## 2018-03-06 DIAGNOSIS — Z0000 Encounter for general adult medical examination without abnormal findings: Secondary | ICD-10-CM | POA: Diagnosis not present

## 2018-03-06 DIAGNOSIS — N138 Other obstructive and reflux uropathy: Secondary | ICD-10-CM | POA: Diagnosis not present

## 2018-03-06 DIAGNOSIS — N401 Enlarged prostate with lower urinary tract symptoms: Secondary | ICD-10-CM | POA: Diagnosis not present

## 2018-03-06 DIAGNOSIS — R030 Elevated blood-pressure reading, without diagnosis of hypertension: Secondary | ICD-10-CM | POA: Diagnosis not present

## 2018-07-10 DIAGNOSIS — H401133 Primary open-angle glaucoma, bilateral, severe stage: Secondary | ICD-10-CM | POA: Diagnosis not present

## 2019-01-07 DIAGNOSIS — H401133 Primary open-angle glaucoma, bilateral, severe stage: Secondary | ICD-10-CM | POA: Diagnosis not present

## 2019-01-17 DIAGNOSIS — H401133 Primary open-angle glaucoma, bilateral, severe stage: Secondary | ICD-10-CM | POA: Diagnosis not present

## 2019-02-21 DIAGNOSIS — H401133 Primary open-angle glaucoma, bilateral, severe stage: Secondary | ICD-10-CM | POA: Diagnosis not present

## 2019-05-12 DIAGNOSIS — Z0000 Encounter for general adult medical examination without abnormal findings: Secondary | ICD-10-CM | POA: Diagnosis not present

## 2019-05-12 DIAGNOSIS — D649 Anemia, unspecified: Secondary | ICD-10-CM | POA: Diagnosis not present

## 2019-05-12 DIAGNOSIS — Z125 Encounter for screening for malignant neoplasm of prostate: Secondary | ICD-10-CM | POA: Diagnosis not present

## 2019-05-12 DIAGNOSIS — R030 Elevated blood-pressure reading, without diagnosis of hypertension: Secondary | ICD-10-CM | POA: Diagnosis not present

## 2019-05-12 DIAGNOSIS — N401 Enlarged prostate with lower urinary tract symptoms: Secondary | ICD-10-CM | POA: Diagnosis not present

## 2019-05-12 DIAGNOSIS — Z79899 Other long term (current) drug therapy: Secondary | ICD-10-CM | POA: Diagnosis not present

## 2019-05-12 DIAGNOSIS — E7800 Pure hypercholesterolemia, unspecified: Secondary | ICD-10-CM | POA: Diagnosis not present

## 2019-05-12 DIAGNOSIS — N138 Other obstructive and reflux uropathy: Secondary | ICD-10-CM | POA: Diagnosis not present

## 2019-06-05 NOTE — Progress Notes (Signed)
[facility]  [address], [address] [place], [place] [address] Phone: [phone]  Fax: [phone]   Clinic Day:  06/09/2019  Referring physician: Ezequiel Kayser, MD  Chief Complaint: Dylan Young is a 73 y.o. 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 He denied any symptoms.  He specifically denies any fevers, sweats or weight loss.  He denies any issues with recurrent infections.  He denies any bone pain.  He notes a history of chronic anemia.  Hemoglobin was 13.6 on 02/20/2017 and 13.8 on 08/22/2017.  Diet is good.  Colonoscopy is due in 2 years.  Labs on 05/12/2019:  Hemoglobin 14.9, hematocrit 44.3, platelets 260,000, and WBC 6200 ([place] 3510).  Ferritin was 134. Creatinine was 1.0.  Calcium was 9.5.  M protein was 0.9 gm/dL.  Retic was 1.42%.  Urinalysis showed trace ketones.   Symptomatically, he feels "normal".  He denies any symptoms.  He has arthritis in his shoulders, knees, and lower back.    Past Medical History:  Diagnosis Date  . Arthritis   . Benign neoplasm of colon   . Benign prostatic hypertrophy with lower urinary tract symptoms (LUTS)   . Dysphagia   . ED (erectile dysfunction)   . Glaucoma   . Heart burn   . History of stomach ulcers   . Hyperlipidemia   . Hypogonadism male   . Kidney stones   . Obesity   . Osteoarthritis   . Tubular adenoma of colon   . Urinary frequency   . Urinary frequency   . Urinary urgency   . Vitamin D deficiency     Past Surgical History:  Procedure Laterality Date  . APPENDECTOMY    . CATARACT EXTRACTION     Right (1996); Left (2007)  . COLONOSCOPY WITH PROPOFOL N/A 05/08/2016   Procedure: COLONOSCOPY WITH PROPOFOL;  Surgeon: Lollie Sails, MD;  Location: [facility] ENDOSCOPY;  Service: Endoscopy;  Laterality: N/A;  .  EXTRACORPOREAL SHOCK WAVE LITHOTRIPSY Right 05/27/2015   Procedure: EXTRACORPOREAL SHOCK WAVE LITHOTRIPSY (ESWL);  Surgeon: Hollice Espy, MD;  Location: [facility] ORS;  Service: Urology;  Laterality: Right;  . EYE SURGERY    . LITHOTRIPSY    . RETINAL DETACHMENT SURGERY      Family History  Problem Relation Age of Onset  . Prostate cancer Father   . Alzheimer's disease Father   . Diabetes Mother   . Stroke Mother     Social History:  reports that he has quit smoking. He quit smokeless tobacco use about 44 years ago. He reports current alcohol use of about 1.0 standard drinks of alcohol per week. He reports that he does not use drugs.  He stopped smoking 45 years ago.  He rarely drinks alcohol.  He denies any exposure to radiation or toxins.  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 The patient lives in [place].  The patient is alone today.  Allergies: No Known Allergies  Current Medications: Current Outpatient Medications  Medication Sig Dispense Refill  . bromfenac (XIBROM) 0.09 % ophthalmic solution Apply to eye.    . Dorzolamide HCl-Timolol Mal PF 22.3-6.8 MG/ML SOLN Place 1 drop into both eyes 2 (two) times daily.    Marland Kitchen latanoprost (XALATAN) 0.005 % ophthalmic solution Apply to eye.    Marland Kitchen amoxicillin (AMOXIL) 875 MG tablet Take  1 tablet (875 mg total) by mouth 2 (two) times daily. (Patient not taking: Reported on 06/09/2019) 20 tablet 0  . Cholecalciferol (VITAMIN D-3) 1000 UNITS CAPS Take 1 capsule by mouth daily.    Marland Kitchen doxazosin (CARDURA) 4 MG tablet Take 4 mg by mouth daily.    Marland Kitchen dutasteride (AVODART) 0.5 MG capsule Take 0.5 mg by mouth daily.    . Multiple Vitamins-Minerals (MULTIVITAMIN ADULT PO) Take 1 tablet by mouth daily.    . Vit B6-Vit B12-Omega 3 Acids (VITAMIN B PLUS+ PO) Take 1 tablet by mouth daily.    . vitamin C (ASCORBIC ACID) 500 MG tablet Take 500 mg by mouth 4 (four) times daily.    . vitamin E 400 UNIT capsule Take 400 Units by mouth daily.      No current facility-administered medications for this visit.     Review of Systems  Constitutional: Negative.  Negative for chills, diaphoresis, fever, malaise/fatigue and weight loss.       Feels "normal".  HENT: Negative for congestion, hearing loss, sinus pain and sore throat.   Eyes: Negative for double vision and pain.       Severe glaucoma.  Respiratory: Negative.  Negative for cough, shortness of breath and wheezing.   Cardiovascular: Negative.  Negative for chest pain, palpitations and leg swelling.  Gastrointestinal: Negative.  Negative for abdominal pain, blood in stool, constipation, diarrhea, melena, nausea and vomiting.  Genitourinary: Negative.  Negative for dysuria, frequency, hematuria and urgency.  Musculoskeletal: Positive for joint pain (shoulders, knees, lower back). Negative for back pain, myalgias and neck pain.  Skin: Negative.  Negative for rash.  Neurological: Negative.  Negative for dizziness, tingling, sensory change, weakness and headaches.  Endo/Heme/Allergies: Negative.  Does not bruise/bleed easily.  Psychiatric/Behavioral: Negative.  Negative for depression and memory loss. The patient is not nervous/anxious and does not have insomnia.   All other systems reviewed and are negative.  Performance status (ECOG): 1  Blood pressure (!) 145/75, pulse 63, temperature (!) 97.5 F (36.4 C), temperature source Oral, resp. rate 18, height 5\' 5"  (1.651 m), weight 158 lb 15.2 oz (72.1 kg), SpO2 100 %.   Physical Exam  Constitutional: He is oriented to person, place, and time. He appears well-developed and well-nourished. No distress.  HENT:  Head: Normocephalic and atraumatic.  Mouth/Throat: Oropharynx is clear and moist. No oropharyngeal exudate.  Short gray hair and beard.  Eyes: Pupils are equal, round, and reactive to light. Conjunctivae and EOM are normal. No scleral icterus.  Silver rimmed glasses.  Brown eyes.  Neck: Normal range of motion. Neck supple.  No JVD present.  Cardiovascular: Normal rate, regular rhythm and normal heart sounds.  No murmur heard. Pulmonary/Chest: Effort normal and breath sounds normal. No respiratory distress. He has no wheezes. He has no rales.  Abdominal: Soft. Bowel sounds are normal. He exhibits no distension and no mass. There is no abdominal tenderness. There is no rebound and no guarding.  Musculoskeletal: Normal range of motion.        General: No tenderness or edema.  Lymphadenopathy:    He has no cervical adenopathy.    He has no axillary adenopathy.       Right: No supraclavicular adenopathy present.       Left: No supraclavicular adenopathy present.  Neurological: He is alert and oriented to person, place, and time. He has normal reflexes.  Skin: Skin is warm and dry. No rash noted. He is not diaphoretic. No erythema. No pallor.  Psychiatric: He has a normal mood and affect. His behavior is normal. Judgment and thought content normal.  Nursing note and vitals reviewed.   No visits with results within 3 Day(s) from this visit.  Latest known visit with results is:  Admission on 05/08/2016, Discharged on 05/08/2016  Component Date Value Ref Range Status  . SURGICAL PATHOLOGY 05/08/2016    Final                   Value:Surgical Pathology CASE: [id number] PATIENT: Janett Labella Surgical Pathology Report     SPECIMEN SUBMITTED: A. Colon polyp, distal descending, cbx B. Colon polyp, proximal transverse, cbx  CLINICAL HISTORY: None provided  PRE-OPERATIVE DIAGNOSIS: PH ADEN polyps  POST-OPERATIVE DIAGNOSIS: Diverticulosis, colon polyps     DIAGNOSIS: A. COLON POLYP, DISTAL DESCENDING; COLD BIOPSY: - TUBULAR ADENOMA. - NEGATIVE FOR HIGH-GRADE DYSPLASIA AND MALIGNANCY.  B. COLON POLYP, PROXIMAL TRANSVERSE; COLD BIOPSY: - COLONIC MUCOSA WITH SMALL LYMPHOID AGGREGATE. - NEGATIVE FOR DYSPLASIA AND MALIGNANCY.   GROSS DESCRIPTION:  A.  Labeled: distal descending colon polyp C  BX  Tissue fragment(s): 1  Size: 0.3 cm  Description: Tan  Entirely submitted in 1 cassette(s).   B. Labeled: proximal transverse colon polyp C BX  Tissue fragment(s): 2  Size: 0.3 cm  Description: Tan  Entirely submitted in 1 cassette(s).    Final Diagnosis performed by Orlene Och, MD.  Electronically signed 05/09/2016 2:06:32PM    The electronic signature indicates that the named Attending Pathologist has evaluated the specimen  Technical component performed at [facility], [address], [place], [place] [address] Lab: [phone] Dir: Darrick Penna. Evette Doffing, MD  Professional component performed at [facility], [facility], [place], [place], [place] [address] Lab: [phone] Dir: Dellia Nims. Reuel Derby, MD      Assessment:  DELLAS GUARD is a 73 y.o. 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  Labs on 05/12/2019 revealed a hemoglobin 14.9, hematocrit 44.3, platelets 260,000, and WBC 6200 ([place] 3510).  Ferritin was 134. Creatinine was 1.0.  Calcium was 9.5.  M-spike was 0.9 gm/dL.  Symptomatically, he denies any fevers, sweats, weight loss, adenopathy, bruising/bleeding or recurrent infections.  Exam is unremarkable.  Plan: 1.   Labs today:  CBC with diff, CMP, myeloma panel, FLCA, beta2-microglobulin, LDH. 2.   Monoclonal gammopathy  Discuss recent labs revealing a SPEP of 0.9 gm/dL.  Discuss a monoclonal gammopathy of unknown significance (MGUS) versus a lymphoplasmacytic disorder such as myeloma.  Discuss additional testing.  Collect 24 hour urine for UPEP and free light chains.  Consider bone survey.  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 3.   RTC in 2 weeks for MD assessment, review of work-up, and discussion regarding direction of therapy.  I discussed the assessment and treatment plan with the patient.  The patient was provided an opportunity to ask questions and all were answered.  The patient  agreed with the plan and demonstrated an understanding of the instructions.  The patient was advised to call back if the symptoms worsen or if the condition fails to improve as anticipated.   Melissa C. Mike Gip, MD, PhD    06/09/2019, 3:21 PM

## 2019-06-08 DIAGNOSIS — D472 Monoclonal gammopathy: Secondary | ICD-10-CM | POA: Insufficient documentation

## 2019-06-09 ENCOUNTER — Inpatient Hospital Stay: Payer: PPO

## 2019-06-09 ENCOUNTER — Other Ambulatory Visit: Payer: Self-pay

## 2019-06-09 ENCOUNTER — Encounter: Payer: Self-pay | Admitting: Hematology and Oncology

## 2019-06-09 ENCOUNTER — Inpatient Hospital Stay: Payer: PPO | Attending: Hematology and Oncology | Admitting: Hematology and Oncology

## 2019-06-09 DIAGNOSIS — D472 Monoclonal gammopathy: Secondary | ICD-10-CM

## 2019-06-09 DIAGNOSIS — D649 Anemia, unspecified: Secondary | ICD-10-CM

## 2019-06-09 DIAGNOSIS — Z87891 Personal history of nicotine dependence: Secondary | ICD-10-CM | POA: Insufficient documentation

## 2019-06-09 DIAGNOSIS — Z79899 Other long term (current) drug therapy: Secondary | ICD-10-CM | POA: Diagnosis not present

## 2019-06-09 LAB — CBC WITH DIFFERENTIAL/PLATELET
Abs Immature Granulocytes: 0.02 10*3/uL (ref 0.00–0.07)
Basophils Absolute: 0 10*3/uL (ref 0.0–0.1)
Basophils Relative: 0 %
Eosinophils Absolute: 0.1 10*3/uL (ref 0.0–0.5)
Eosinophils Relative: 1 %
HCT: 40.7 % (ref 39.0–52.0)
Hemoglobin: 14.1 g/dL (ref 13.0–17.0)
Immature Granulocytes: 0 %
Lymphocytes Relative: 35 %
Lymphs Abs: 2.4 10*3/uL (ref 0.7–4.0)
MCH: 31.7 pg (ref 26.0–34.0)
MCHC: 34.6 g/dL (ref 30.0–36.0)
MCV: 91.5 fL (ref 80.0–100.0)
Monocytes Absolute: 0.5 10*3/uL (ref 0.1–1.0)
Monocytes Relative: 8 %
Neutro Abs: 3.8 10*3/uL (ref 1.7–7.7)
Neutrophils Relative %: 56 %
Platelets: 231 10*3/uL (ref 150–400)
RBC: 4.45 MIL/uL (ref 4.22–5.81)
RDW: 12.6 % (ref 11.5–15.5)
WBC: 6.8 10*3/uL (ref 4.0–10.5)
nRBC: 0 % (ref 0.0–0.2)

## 2019-06-09 LAB — LACTATE DEHYDROGENASE: LDH: 131 U/L (ref 98–192)

## 2019-06-09 LAB — COMPREHENSIVE METABOLIC PANEL
ALT: 20 U/L (ref 0–44)
AST: 21 U/L (ref 15–41)
Albumin: 4.3 g/dL (ref 3.5–5.0)
Alkaline Phosphatase: 58 U/L (ref 38–126)
Anion gap: 9 (ref 5–15)
BUN: 16 mg/dL (ref 8–23)
CO2: 24 mmol/L (ref 22–32)
Calcium: 9.2 mg/dL (ref 8.9–10.3)
Chloride: 102 mmol/L (ref 98–111)
Creatinine, Ser: 0.83 mg/dL (ref 0.61–1.24)
GFR calc Af Amer: >60 mL/min (ref >60)
GFR calc non Af Amer: >60 mL/min (ref >60)
Glucose, Bld: 91 mg/dL (ref 70–99)
Potassium: 4 mmol/L (ref 3.5–5.1)
Sodium: 135 mmol/L (ref 135–145)
Total Bilirubin: 1 mg/dL (ref 0.3–1.2)
Total Protein: 8 g/dL (ref 6.5–8.1)

## 2019-06-10 LAB — MULTIPLE MYELOMA PANEL, SERUM
Albumin SerPl Elph-Mcnc: 3.7 g/dL (ref 2.9–4.4)
Albumin/Glob SerPl: 1 (ref 0.7–1.7)
Alpha 1: 0.2 g/dL (ref 0.0–0.4)
Alpha2 Glob SerPl Elph-Mcnc: 0.6 g/dL (ref 0.4–1.0)
B-Globulin SerPl Elph-Mcnc: 1 g/dL (ref 0.7–1.3)
Gamma Glob SerPl Elph-Mcnc: 2 g/dL — ABNORMAL HIGH (ref 0.4–1.8)
Globulin, Total: 3.9 g/dL (ref 2.2–3.9)
IgA: 286 mg/dL (ref 61–437)
IgG (Immunoglobin G), Serum: 1976 mg/dL — ABNORMAL HIGH (ref 603–1613)
IgM (Immunoglobulin M), Srm: 92 mg/dL (ref 15–143)
M Protein SerPl Elph-Mcnc: 0.8 g/dL — ABNORMAL HIGH
Total Protein ELP: 7.6 g/dL (ref 6.0–8.5)

## 2019-06-10 LAB — KAPPA/LAMBDA LIGHT CHAINS
Kappa free light chain: 17 mg/L (ref 3.3–19.4)
Kappa, lambda light chain ratio: 0.54 (ref 0.26–1.65)
Lambda free light chains: 31.3 mg/L — ABNORMAL HIGH (ref 5.7–26.3)

## 2019-06-10 LAB — BETA 2 MICROGLOBULIN, SERUM: Beta-2 Microglobulin: 1.5 mg/L (ref 0.6–2.4)

## 2019-06-11 DIAGNOSIS — D472 Monoclonal gammopathy: Secondary | ICD-10-CM | POA: Diagnosis not present

## 2019-06-12 LAB — UPEP/TP, 24-HR URINE
Albumin, U: 100 %
Alpha 1, Urine: 0 %
Alpha 2, Urine: 0 %
Beta, Urine: 0 %
Gamma Globulin, Urine: 0 %
Total Protein, Urine-Ur/day: 136 mg/24 hr (ref 30–150)
Total Protein, Urine: 8 mg/dL
Total Volume: 1700

## 2019-06-16 DIAGNOSIS — H401133 Primary open-angle glaucoma, bilateral, severe stage: Secondary | ICD-10-CM | POA: Diagnosis not present

## 2019-06-22 NOTE — Progress Notes (Signed)
[facility]  [address], [address] [place], [place] [address] Phone: [phone]  Fax: [phone]   Clinic Day:  06/23/2019  Referring physician: Ezequiel Kayser, MD  Chief Complaint: Dylan Young is a 73 y.o. male with a monoclonal gammopathy of unknown significance who is seen for review of work-up and discussion regarding direction of therapy.  HPI: The patient was last seen in the medical oncology clinic on 06/09/2019 for initial consultation. At that time, he denied any fevers, sweats, weight loss, adenopathy, bruising/bleeding or recurrent infections.  Exam was unremarkable.  Work-up on 06/09/2019: WBC 6,800, hemoglobin 14.1, hematocrit 40.7, platelets 231,000.  Creatinine was 0.83.  Calcium was 9.2.  M-protein was 0.8 gm/dL IgG with lambda light chain specificty. Kappa light chain 17.0, lambda light chain 31.3, ratio 0.54 (0.26-1.65).  LDH 131. Beta-2 microglobulin 1.5 (0.6-2.4). IgG was 1,976, IgA 286, IgM 92. 24hr UPEP was negative.   During the interim, he is doing well. He denies any concerns.    Past Medical History:  Diagnosis Date  . Arthritis   . Benign neoplasm of colon   . Benign prostatic hypertrophy with lower urinary tract symptoms (LUTS)   . Dysphagia   . ED (erectile dysfunction)   . Glaucoma   . Heart burn   . History of stomach ulcers   . Hyperlipidemia   . Hypogonadism male   . Kidney stones   . Obesity   . Osteoarthritis   . Tubular adenoma of colon   . Urinary frequency   . Urinary frequency   . Urinary urgency   . Vitamin D deficiency     Past Surgical History:  Procedure Laterality Date  . APPENDECTOMY    . CATARACT EXTRACTION     Right (1996); Left (2007)  . COLONOSCOPY WITH PROPOFOL N/A 05/08/2016   Procedure: COLONOSCOPY WITH PROPOFOL;  Surgeon: Lollie Sails, MD;  Location: [facility] ENDOSCOPY;  Service: Endoscopy;  Laterality: N/A;  . EXTRACORPOREAL SHOCK WAVE LITHOTRIPSY Right 05/27/2015   Procedure:  EXTRACORPOREAL SHOCK WAVE LITHOTRIPSY (ESWL);  Surgeon: Hollice Espy, MD;  Location: [facility] ORS;  Service: Urology;  Laterality: Right;  . EYE SURGERY    . LITHOTRIPSY    . RETINAL DETACHMENT SURGERY      Family History  Problem Relation Age of Onset  . Prostate cancer Father   . Alzheimer's disease Father   . Diabetes Mother   . Stroke Mother     Social History:  reports that he has quit smoking. He quit smokeless tobacco use about 44 years ago. He reports current alcohol use of about 1.0 standard drinks of alcohol per week. He reports that he does not use drugs. He stopped smoking 45 years ago.  He rarely drinks alcohol.  He denies any exposure to radiation or toxins.  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 The patient lives in [place].  The patient is alone today.  Allergies: No Known Allergies  Current Medications: Current Outpatient Medications  Medication Sig Dispense Refill  . bromfenac (XIBROM) 0.09 % ophthalmic solution Apply 1 drop to eye 2 (two) times daily.     . Cholecalciferol (VITAMIN D-3) 1000 UNITS CAPS Take 1 capsule by mouth daily.    . Dorzolamide HCl-Timolol Mal PF 22.3-6.8 MG/ML SOLN Place 1 drop into both eyes 2 (two) times daily.    Marland Kitchen latanoprost (XALATAN) 0.005 % ophthalmic solution Apply 1 drop to eye at bedtime.     . Multiple Vitamins-Minerals (MULTIVITAMIN ADULT  PO) Take 1 tablet by mouth daily.    . Vit B6-Vit B12-Omega 3 Acids (VITAMIN B PLUS+ PO) Take 1 tablet by mouth daily.    . vitamin C (ASCORBIC ACID) 500 MG tablet Take 500 mg by mouth 4 (four) times daily.    . vitamin E 400 UNIT capsule Take 400 Units by mouth daily.     No current facility-administered medications for this visit.     Review of Systems  Constitutional: Negative.  Negative for chills, diaphoresis, fever, malaise/fatigue and weight loss.       Feels "great".  HENT: Negative for congestion, ear pain, hearing loss, nosebleeds, sinus pain and sore throat.    Eyes: Negative for double vision and pain.       Severe glaucoma.  Respiratory: Negative.  Negative for cough, shortness of breath and wheezing.   Cardiovascular: Negative.  Negative for chest pain, palpitations and leg swelling.  Gastrointestinal: Negative.  Negative for abdominal pain, blood in stool, constipation, diarrhea, melena, nausea and vomiting.  Genitourinary: Negative.  Negative for dysuria, frequency, hematuria and urgency.  Musculoskeletal: Positive for joint pain (shoulders, knees, lower back). Negative for back pain, myalgias and neck pain.  Skin: Negative.  Negative for rash.  Neurological: Negative.  Negative for dizziness, tingling, sensory change, weakness and headaches.  Endo/Heme/Allergies: Negative.  Does not bruise/bleed easily.  Psychiatric/Behavioral: Negative.  Negative for depression and memory loss. The patient is not nervous/anxious and does not have insomnia.   All other systems reviewed and are negative.  Performance status (ECOG):  1  Blood pressure (!) 152/80, pulse (!) 56, temperature 97.7 F (36.5 C), temperature source Oral, resp. rate 16, weight 160 lb 2.6 oz (72.7 kg), SpO2 100 %.   Physical Exam  Constitutional: He is oriented to person, place, and time. He appears well-developed and well-nourished. No distress.  HENT:  Head: Normocephalic and atraumatic.  Short gray hair and beard.  Mask.  Eyes: Conjunctivae and EOM are normal. No scleral icterus.  Silver rimmed glasses.  Brown eyes.  Neurological: He is alert and oriented to person, place, and time.  Skin: Skin is warm. He is not diaphoretic. No pallor.  Psychiatric: He has a normal mood and affect. His behavior is normal. Judgment and thought content normal.  Nursing note and vitals reviewed.   No visits with results within 3 Day(s) from this visit.  Latest known visit with results is:  Appointment on 06/09/2019  Component Date Value Ref Range Status  . Beta-2 Microglobulin 06/09/2019  1.5  0.6 - 2.4 mg/L Final   Comment: (NOTE) Siemens Immulite 2000 Immunochemiluminometric assay (ICMA) Values obtained with different assay methods or kits cannot be used interchangeably. Results cannot be interpreted as absolute evidence of the presence or absence of malignant disease. Performed At: [facility] [place], [place] [id number] Rush Farmer MD [id number]   . LDH 06/09/2019 131  98 - 192 U/L Final   Performed at [facility], [address] [place], [place] [address]  . Kappa free light chain 06/09/2019 17.0  3.3 - 19.4 mg/L Final  . Lamda free light chains 06/09/2019 31.3* 5.7 - 26.3 mg/L Final  . Kappa, lamda light chain ratio 06/09/2019 0.54  0.26 - 1.65 Final   Comment: (NOTE) Performed At: [facility] [place], [place] [id number] Rush Farmer MD [id number]   . IgG (Immunoglobin G), Serum 06/09/2019 1,976* 603 - 1,613 mg/dL Final  . IgA 06/09/2019 286  61 -  437 mg/dL Final  . IgM (Immunoglobulin M), Srm 06/09/2019 92  15 - 143 mg/dL Final  . Total Protein ELP 06/09/2019 7.6  6.0 - 8.5 g/dL Corrected  . Albumin SerPl Elph-Mcnc 06/09/2019 3.7  2.9 - 4.4 g/dL Corrected  . Alpha 1 06/09/2019 0.2  0.0 - 0.4 g/dL Corrected  . Alpha2 Glob SerPl Elph-Mcnc 06/09/2019 0.6  0.4 - 1.0 g/dL Corrected  . B-Globulin SerPl Elph-Mcnc 06/09/2019 1.0  0.7 - 1.3 g/dL Corrected  . Gamma Glob SerPl Elph-Mcnc 06/09/2019 2.0* 0.4 - 1.8 g/dL Corrected  . M Protein SerPl Elph-Mcnc 06/09/2019 0.8* Not Observed g/dL Corrected  . Globulin, Total 06/09/2019 3.9  2.2 - 3.9 g/dL Corrected  . Albumin/Glob SerPl 06/09/2019 1.0  0.7 - 1.7 Corrected  . IFE 1 06/09/2019 Comment   Corrected   Comment: (NOTE) Immunofixation shows IgG monoclonal protein with lambda light chain specificity.   . Please Note 06/09/2019 Comment   Corrected   Comment: (NOTE) Protein electrophoresis scan will follow via computer, mail, or courier  delivery. Performed At: [facility] [place], [place] [id number] Rush Farmer MD [id number]   . Sodium 06/09/2019 135  135 - 145 mmol/L Final  . Potassium 06/09/2019 4.0  3.5 - 5.1 mmol/L Final  . Chloride 06/09/2019 102  98 - 111 mmol/L Final  . CO2 06/09/2019 24  22 - 32 mmol/L Final  . Glucose, Bld 06/09/2019 91  70 - 99 mg/dL Final  . BUN 06/09/2019 16  8 - 23 mg/dL Final  . Creatinine, Ser 06/09/2019 0.83  0.61 - 1.24 mg/dL Final  . Calcium 06/09/2019 9.2  8.9 - 10.3 mg/dL Final  . Total Protein 06/09/2019 8.0  6.5 - 8.1 g/dL Final  . Albumin 06/09/2019 4.3  3.5 - 5.0 g/dL Final  . AST 06/09/2019 21  15 - 41 U/L Final  . ALT 06/09/2019 20  0 - 44 U/L Final  . Alkaline Phosphatase 06/09/2019 58  38 - 126 U/L Final  . Total Bilirubin 06/09/2019 1.0  0.3 - 1.2 mg/dL Final  . GFR calc non Af Amer 06/09/2019 >60  >60 mL/min Final  . GFR calc Af Amer 06/09/2019 >60  >60 mL/min Final  . Anion gap 06/09/2019 9  5 - 15 Final   Performed at [facility], [address] [place],  [address]  . WBC 06/09/2019 6.8  4.0 - 10.5 K/uL Final  . RBC 06/09/2019 4.45  4.22 - 5.81 MIL/uL Final  . Hemoglobin 06/09/2019 14.1  13.0 - 17.0 g/dL Final  . HCT 06/09/2019 40.7  39.0 - 52.0 % Final  . MCV 06/09/2019 91.5  80.0 - 100.0 fL Final  . MCH 06/09/2019 31.7  26.0 - 34.0 pg Final  . MCHC 06/09/2019 34.6  30.0 - 36.0 g/dL Final  . RDW 06/09/2019 12.6  11.5 - 15.5 % Final  . Platelets 06/09/2019 231  150 - 400 K/uL Final  . nRBC 06/09/2019 0.0  0.0 - 0.2 % Final  . Neutrophils Relative % 06/09/2019 56  % Final  . Neutro Abs 06/09/2019 3.8  1.7 - 7.7 K/uL Final  . Lymphocytes Relative 06/09/2019 35  % Final  . Lymphs Abs 06/09/2019 2.4  0.7 - 4.0 K/uL Final  . Monocytes Relative 06/09/2019 8  % Final  . Monocytes Absolute 06/09/2019 0.5  0.1 - 1.0 K/uL Final  . Eosinophils Relative 06/09/2019 1  % Final  . Eosinophils Absolute 06/09/2019 0.1  0.0 -  0.5 K/uL  Final  . Basophils Relative 06/09/2019 0  % Final  . Basophils Absolute 06/09/2019 0.0  0.0 - 0.1 K/uL Final  . Immature Granulocytes 06/09/2019 0  % Final  . Abs Immature Granulocytes 06/09/2019 0.02  0.00 - 0.07 K/uL Final   Performed at [facility], [address] [place], [place] [address]  . Total Protein, Urine 06/11/2019 8.0  Not Estab. mg/dL Final  . Total Protein, Urine-Ur/day 06/11/2019 136  30 - 150 mg/24 hr Final  . Albumin, U 06/11/2019 100.0  % Final  . Alpha 1, Urine 06/11/2019 0.0  % Final  . Alpha 2, Urine 06/11/2019 0.0  % Final  . Beta, Urine 06/11/2019 0.0  % Final  . Gamma Globulin, Urine 06/11/2019 0.0  % Final  . M-spike, % 06/11/2019 Not Observed  Not Observed % Final  . Please Note: 06/11/2019 Comment   Final   Comment: (NOTE) Protein electrophoresis scan will follow via computer, mail, or courier delivery. Performed At: [facility] [place], [place] [id number] Rush Farmer MD [id number]   . Total Volume 06/11/2019 1,700   Final   Performed at [facility], [address] [place], [place] [address]    Assessment:  Dylan Young is a 73 y.o. male with a monoclonal gammopathy of unknown significance (MGUS).  M-protein is IgG with lambda light chain specificity.  Labs on 05/12/2019 revealed a hemoglobin 14.9, hematocrit 44.3, platelets 260,000, and WBC 6200 ([place] 3510).  Ferritin was 134. Creatinine was 1.0.  Calcium was 9.5.  M-spike was 0.9 gm/dL.  Work-up on 06/09/2019 revealed a 0.8 gm/dL IgG with lambda monoclonal protein with light chain specificty.  Lambda light chain were 31.3 (ratio 0.54).  LDH and beta-2 microglobulin were normal. IgG was 1,976. 24hr UPEP was negative.   Symptomatically, he is doing well.  He voices no concerns.  Plan: 1.  Review work-up from 06/09/2019.  2.  Monoclonal gammopathy of unknown significance (MGUS)             Clinically he is doing well.               SPEP reveals a small IgG lambda monoclonal protein.   No evidence of end organ damage (CRAB criteria).             Risk of transformation to a lymphoplasmacytic disorder estimated at 1 %/year   Discuss surveillance every 6 months. 3.   RTC in 6 months for MD assessment and labs (CBC with diff, CMP, SPEP).  I discussed the assessment and treatment plan with the patient.  The patient was provided an opportunity to ask questions and all were answered.  The patient agreed with the plan and demonstrated an understanding of the instructions.  The patient was advised to call back if the symptoms worsen or if the condition fails to improve as anticipated.  I provided 10 minutes of face-to-face time during this this encounter and > 50% was spent counseling as documented under my assessment and plan.    Lequita Asal, MD, PhD    06/23/2019, 11:17 AM  I, Molly Dorshimer, am acting as Education administrator for [employer]. Mike Gip, MD, PhD.  I, Melissa C. Mike Gip, MD, have reviewed the above documentation for accuracy and completeness, and I agree with the above.

## 2019-06-23 ENCOUNTER — Inpatient Hospital Stay (HOSPITAL_BASED_OUTPATIENT_CLINIC_OR_DEPARTMENT_OTHER): Payer: PPO | Admitting: Hematology and Oncology

## 2019-06-23 ENCOUNTER — Other Ambulatory Visit: Payer: Self-pay

## 2019-06-23 ENCOUNTER — Encounter: Payer: Self-pay | Admitting: Hematology and Oncology

## 2019-06-23 VITALS — BP 152/80 | HR 56 | Temp 97.7°F | Resp 16 | Wt 160.2 lb

## 2019-06-23 DIAGNOSIS — D472 Monoclonal gammopathy: Secondary | ICD-10-CM

## 2019-06-23 NOTE — Progress Notes (Signed)
Pt here for follow up. Denies any concerns.  

## 2019-07-10 DIAGNOSIS — H401134 Primary open-angle glaucoma, bilateral, indeterminate stage: Secondary | ICD-10-CM | POA: Diagnosis not present

## 2019-07-24 DIAGNOSIS — H401134 Primary open-angle glaucoma, bilateral, indeterminate stage: Secondary | ICD-10-CM | POA: Diagnosis not present

## 2019-08-07 DIAGNOSIS — H401134 Primary open-angle glaucoma, bilateral, indeterminate stage: Secondary | ICD-10-CM | POA: Diagnosis not present

## 2019-09-17 DIAGNOSIS — H401134 Primary open-angle glaucoma, bilateral, indeterminate stage: Secondary | ICD-10-CM | POA: Diagnosis not present

## 2019-12-10 DIAGNOSIS — H401134 Primary open-angle glaucoma, bilateral, indeterminate stage: Secondary | ICD-10-CM | POA: Diagnosis not present

## 2019-12-22 ENCOUNTER — Other Ambulatory Visit: Payer: PPO

## 2019-12-22 ENCOUNTER — Ambulatory Visit: Payer: PPO | Admitting: Hematology and Oncology

## 2020-01-17 NOTE — Progress Notes (Signed)
[facility]  [address], [address] [place], [place] [address] Phone: [phone]  Fax: [phone]   Telephone Visit:  01/20/2020  Referring physician: Ezequiel Kayser, MD  I connected with Merlyn Albert Eltzroth on 01/20/2020 at 10:38 AM by telephone and verified that I was speaking with the correct person using 2 identifiers.  The patient was at home.  I discussed the limitations, risk, security and privacy concerns of performing an evaluation and management service by telephone and the availability of in person appointments.  I also discussed with the patient that there may be a patient responsible charge related to this service.  The patient expressed understanding and agreed to proceed.   Chief Complaint: Dylan Young is a 74 y.o. male with a monoclonal gammopathyof unknown significance (MGUS) who is seen for a 7 month assessment.   HPI: The patient was last seen in the medical oncology clinic on 06/23/2019. At that time, we reviewed his initial work-up.   Evaluation revealed no evidence of end organ damage (CRAB criteria).  He was asymptomatic.  M-spike was 0.8 gm/dL.  We dicussed surveillance every 6 months.   Labs on 01/19/2020: Hematocrit 44.7, hemoglobin 15.1, platelets 258,000, WBC 7,500.  Total protein was 8.6. SPEP revealed a 0.8 gm/dL monoclonal spike (available after clinic).   During the interim, he has felt "ok".  He has bilateral shoulder and knee pain. He has lower back pain. He attributes his joint pain to arthritis. He denies any infections and B symptoms. He has no bone pain. I advised patient to call the clinic if he has any concerns or experiences symptoms.  He notes being active. He likes to walk a couple times a week.    Past Medical History:  Diagnosis Date  . Arthritis   . Benign neoplasm of colon   . Benign prostatic hypertrophy with lower urinary tract symptoms (LUTS)   . Dysphagia   . ED (erectile dysfunction)   . Glaucoma   .  Heart burn   . History of stomach ulcers   . Hyperlipidemia   . Hypogonadism male   . Kidney stones   . Obesity   . Osteoarthritis   . Tubular adenoma of colon   . Urinary frequency   . Urinary frequency   . Urinary urgency   . Vitamin D deficiency     Past Surgical History:  Procedure Laterality Date  . APPENDECTOMY    . CATARACT EXTRACTION     Right (1996); Left (2007)  . COLONOSCOPY WITH PROPOFOL N/A 05/08/2016   Procedure: COLONOSCOPY WITH PROPOFOL;  Surgeon: Lollie Sails, MD;  Location: [facility] ENDOSCOPY;  Service: Endoscopy;  Laterality: N/A;  . EXTRACORPOREAL SHOCK WAVE LITHOTRIPSY Right 05/27/2015   Procedure: EXTRACORPOREAL SHOCK WAVE LITHOTRIPSY (ESWL);  Surgeon: Hollice Espy, MD;  Location: [facility] ORS;  Service: Urology;  Laterality: Right;  . EYE SURGERY    . LITHOTRIPSY    . RETINAL DETACHMENT SURGERY      Family History  Problem Relation Age of Onset  . Prostate cancer Father   . Alzheimer's disease Father   . Diabetes Mother   . Stroke Mother     Social History:  reports that he has quit smoking. He quit smokeless tobacco use about 44 years ago. He reports current alcohol use of about 1.0 standard drinks of alcohol per week. He reports that he does not use drugs. He stopped smoking 45 years ago. He rarely drinks alcohol. He denies any exposure to radiation  or toxins. 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 He likes to garden. The patient is alone today.  Participants in the patient's visit and their role in the encounter included the patient and Vito Berger, CMA, today.  The intake visit was provided by Vito Berger, CMA.  Allergies: No Known Allergies  Current Medications: Current Outpatient Medications  Medication Sig Dispense Refill  . bromfenac (XIBROM) 0.09 % ophthalmic solution Apply 1 drop to eye 2 (two) times daily.     . Cholecalciferol (VITAMIN D-3) 1000 UNITS CAPS Take 1 capsule  by mouth daily.    . Dorzolamide HCl-Timolol Mal PF 22.3-6.8 MG/ML SOLN Place 1 drop into both eyes 2 (two) times daily.    Marland Kitchen latanoprost (XALATAN) 0.005 % ophthalmic solution Apply 1 drop to eye at bedtime.     . Multiple Vitamins-Minerals (MULTIVITAMIN ADULT PO) Take 1 tablet by mouth daily.    . Vit B6-Vit B12-Omega 3 Acids (VITAMIN B PLUS+ PO) Take 1 tablet by mouth daily.    . vitamin C (ASCORBIC ACID) 500 MG tablet Take 500 mg by mouth 4 (four) times daily.    . vitamin E 400 UNIT capsule Take 400 Units by mouth daily.     No current facility-administered medications for this visit.    Review of Systems  Constitutional: Negative.  Negative for chills, diaphoresis, fever, malaise/fatigue and weight loss.       Feels "ok".  Active.  HENT: Negative.  Negative for congestion, ear pain, hearing loss, nosebleeds, sinus pain and sore throat.   Eyes: Negative for double vision and pain.       Severe glaucoma.  Respiratory: Negative.  Negative for cough, shortness of breath and wheezing.   Cardiovascular: Negative.  Negative for chest pain, palpitations and leg swelling.  Gastrointestinal: Negative.  Negative for abdominal pain, blood in stool, constipation, diarrhea, melena, nausea and vomiting.  Genitourinary: Negative.  Negative for dysuria, frequency, hematuria and urgency.  Musculoskeletal: Positive for back pain (lower back) and joint pain (shoulders, knees). Negative for myalgias and neck pain.       Arthritis.  Skin: Negative.  Negative for rash.  Neurological: Negative.  Negative for dizziness, tingling, sensory change, speech change, focal weakness, weakness and headaches.  Endo/Heme/Allergies: Negative.  Does not bruise/bleed easily.  Psychiatric/Behavioral: Negative.  Negative for depression and memory loss. The patient is not nervous/anxious and does not have insomnia.   All other systems reviewed and are negative.  Performance status (ECOG):  1  Physical Exam    Constitutional: He is oriented to person, place, and time.  Neurological: He is alert and oriented to person, place, and time.  Psychiatric: Mood, memory, affect and judgment normal.  Nursing note reviewed.    Appointment on 01/19/2020  Component Date Value Ref Range Status  . Sodium 01/19/2020 134* 135 - 145 mmol/L Final  . Potassium 01/19/2020 4.1  3.5 - 5.1 mmol/L Final  . Chloride 01/19/2020 101  98 - 111 mmol/L Final  . CO2 01/19/2020 26  22 - 32 mmol/L Final  . Glucose, Bld 01/19/2020 107* 70 - 99 mg/dL Final  . BUN 01/19/2020 17  8 - 23 mg/dL Final  . Creatinine, Ser 01/19/2020 0.85  0.61 - 1.24 mg/dL Final  . Calcium 01/19/2020 9.2  8.9 - 10.3 mg/dL Final  . Total Protein 01/19/2020 8.6* 6.5 - 8.1 g/dL Final  . Albumin 01/19/2020 4.5  3.5 - 5.0 g/dL Final  . AST  01/19/2020 17  15 - 41 U/L Final  . ALT 01/19/2020 18  0 - 44 U/L Final  . Alkaline Phosphatase 01/19/2020 61  38 - 126 U/L Final  . Total Bilirubin 01/19/2020 0.6  0.3 - 1.2 mg/dL Final  . GFR calc non Af Amer 01/19/2020 >60  >60 mL/min Final  . GFR calc Af Amer 01/19/2020 >60  >60 mL/min Final  . Anion gap 01/19/2020 7  5 - 15 Final   Performed at [facility], [address] [place], [place] [address]  . WBC 01/19/2020 7.5  4.0 - 10.5 K/uL Final  . RBC 01/19/2020 4.88  4.22 - 5.81 MIL/uL Final  . Hemoglobin 01/19/2020 15.1  13.0 - 17.0 g/dL Final  . HCT 01/19/2020 44.7  39.0 - 52.0 % Final  . MCV 01/19/2020 91.6  80.0 - 100.0 fL Final  . MCH 01/19/2020 30.9  26.0 - 34.0 pg Final  . MCHC 01/19/2020 33.8  30.0 - 36.0 g/dL Final  . RDW 01/19/2020 12.6  11.5 - 15.5 % Final  . Platelets 01/19/2020 258  150 - 400 K/uL Final  . nRBC 01/19/2020 0.0  0.0 - 0.2 % Final  . Neutrophils Relative % 01/19/2020 61  % Final  . Neutro Abs 01/19/2020 4.6  1.7 - 7.7 K/uL Final  . Lymphocytes Relative 01/19/2020 31  % Final  . Lymphs Abs 01/19/2020 2.3  0.7 - 4.0 K/uL Final  . Monocytes Relative 01/19/2020 7  %  Final  . Monocytes Absolute 01/19/2020 0.5  0.1 - 1.0 K/uL Final  . Eosinophils Relative 01/19/2020 1  % Final  . Eosinophils Absolute 01/19/2020 0.1  0.0 - 0.5 K/uL Final  . Basophils Relative 01/19/2020 0  % Final  . Basophils Absolute 01/19/2020 0.0  0.0 - 0.1 K/uL Final  . Immature Granulocytes 01/19/2020 0  % Final  . Abs Immature Granulocytes 01/19/2020 0.02  0.00 - 0.07 K/uL Final   Performed at [facility], [address], [place], [place] [address]    Assessment:  Dylan Young is a 74 y.o. male with a monoclonal gammopathy of unknown significance (MGUS).  M-protein is IgG with lambda light chain specificity.  Labs on 05/18/2020revealed a hemoglobin 14.9, hematocrit 44.3, platelets 260,000, and WBC 6200 ([place] 3510). Ferritin was134.Creatinine was 1.0. Calcium was 9.5. M-spikewas 0.9gm/dL.  Work-up on 06/09/2019 revealed a 0.8 gm/dL IgG with lambda monoclonal protein with light chain specificty.  Lambda light chain were 31.3 (ratio 0.54).  LDH and beta-2 microglobulin were normal. IgG was 1,976. 24hr UPEP was negative.   M-spike has been followed (gm/dL): 0.9 on 05/12/2019, 0.8 on 06/09/2019 and 0.8 on 01/19/2020.  Symptomatically, he feels "ok".  He has arthritis.  Creatinine is 0.85.  Calcium is 9.2.  Protein is 8.6 (prior 8.0 on 06/09/2019).  Plan: 1.   Labs today: CBC with diff, CMP, SPEP. 2.   Monoclonal gammopathy of unknown significance (MGUS) Clinically, he continues to do well. SPEP reveals a small IgG lambda monoclonal protein.              M spike is stable at 0.8 g/dL   He has no evidence of end organ damage (CRAB criteria). Risk of transformation to a lymphoplasmacytic disorder estimated at 1 %/year              Continue surveillance every 6 months. 3.   RTC in 3 months for labs (CBC with diff, BMP, SPEP)  4.   RTC in  6 months for MD assessment and labs (CBC with diff, CMP, SPEP, FLCA).  I discussed  the assessment and treatment plan with the patient.  The patient was provided an opportunity to ask questions and all were answered.  The patient agreed with the plan and demonstrated an understanding of the instructions.  The patient was advised to call back if the symptoms worsen or if the condition fails to improve as anticipated.  I provided 14 minutes (10:38 AM - 10:51 AM) of non face-to-face telephone time during this this encounter and > 50% was spent counseling as documented under my assessment and plan.  I provided these services from the [facility] office    Lequita Asal, MD, PhD    01/20/2020, 10:38 AM  I, Selena Batten, am acting as scribe for [employer] [place]. Mike Gip, MD, PhD.  I, Zeeva Courser C. Mike Gip, MD, have reviewed the above documentation for accuracy and completeness, and I agree with the above.

## 2020-01-19 ENCOUNTER — Inpatient Hospital Stay: Payer: PPO | Attending: Hematology and Oncology

## 2020-01-19 ENCOUNTER — Other Ambulatory Visit: Payer: Self-pay

## 2020-01-19 ENCOUNTER — Encounter: Payer: Self-pay | Admitting: Hematology and Oncology

## 2020-01-19 DIAGNOSIS — E785 Hyperlipidemia, unspecified: Secondary | ICD-10-CM | POA: Diagnosis not present

## 2020-01-19 DIAGNOSIS — Z87891 Personal history of nicotine dependence: Secondary | ICD-10-CM | POA: Insufficient documentation

## 2020-01-19 DIAGNOSIS — M1990 Unspecified osteoarthritis, unspecified site: Secondary | ICD-10-CM | POA: Insufficient documentation

## 2020-01-19 DIAGNOSIS — M25569 Pain in unspecified knee: Secondary | ICD-10-CM | POA: Insufficient documentation

## 2020-01-19 DIAGNOSIS — Z8042 Family history of malignant neoplasm of prostate: Secondary | ICD-10-CM | POA: Insufficient documentation

## 2020-01-19 DIAGNOSIS — M545 Low back pain: Secondary | ICD-10-CM | POA: Insufficient documentation

## 2020-01-19 DIAGNOSIS — H409 Unspecified glaucoma: Secondary | ICD-10-CM | POA: Insufficient documentation

## 2020-01-19 DIAGNOSIS — Z79899 Other long term (current) drug therapy: Secondary | ICD-10-CM | POA: Diagnosis not present

## 2020-01-19 DIAGNOSIS — D472 Monoclonal gammopathy: Secondary | ICD-10-CM | POA: Diagnosis not present

## 2020-01-19 LAB — COMPREHENSIVE METABOLIC PANEL
ALT: 18 U/L (ref 0–44)
AST: 17 U/L (ref 15–41)
Albumin: 4.5 g/dL (ref 3.5–5.0)
Alkaline Phosphatase: 61 U/L (ref 38–126)
Anion gap: 7 (ref 5–15)
BUN: 17 mg/dL (ref 8–23)
CO2: 26 mmol/L (ref 22–32)
Calcium: 9.2 mg/dL (ref 8.9–10.3)
Chloride: 101 mmol/L (ref 98–111)
Creatinine, Ser: 0.85 mg/dL (ref 0.61–1.24)
GFR calc Af Amer: >60 mL/min (ref >60)
GFR calc non Af Amer: >60 mL/min (ref >60)
Glucose, Bld: 107 mg/dL — ABNORMAL HIGH (ref 70–99)
Potassium: 4.1 mmol/L (ref 3.5–5.1)
Sodium: 134 mmol/L — ABNORMAL LOW (ref 135–145)
Total Bilirubin: 0.6 mg/dL (ref 0.3–1.2)
Total Protein: 8.6 g/dL — ABNORMAL HIGH (ref 6.5–8.1)

## 2020-01-19 LAB — CBC WITH DIFFERENTIAL/PLATELET
Abs Immature Granulocytes: 0.02 10*3/uL (ref 0.00–0.07)
Basophils Absolute: 0 10*3/uL (ref 0.0–0.1)
Basophils Relative: 0 %
Eosinophils Absolute: 0.1 10*3/uL (ref 0.0–0.5)
Eosinophils Relative: 1 %
HCT: 44.7 % (ref 39.0–52.0)
Hemoglobin: 15.1 g/dL (ref 13.0–17.0)
Immature Granulocytes: 0 %
Lymphocytes Relative: 31 %
Lymphs Abs: 2.3 10*3/uL (ref 0.7–4.0)
MCH: 30.9 pg (ref 26.0–34.0)
MCHC: 33.8 g/dL (ref 30.0–36.0)
MCV: 91.6 fL (ref 80.0–100.0)
Monocytes Absolute: 0.5 10*3/uL (ref 0.1–1.0)
Monocytes Relative: 7 %
Neutro Abs: 4.6 10*3/uL (ref 1.7–7.7)
Neutrophils Relative %: 61 %
Platelets: 258 10*3/uL (ref 150–400)
RBC: 4.88 MIL/uL (ref 4.22–5.81)
RDW: 12.6 % (ref 11.5–15.5)
WBC: 7.5 10*3/uL (ref 4.0–10.5)
nRBC: 0 % (ref 0.0–0.2)

## 2020-01-19 NOTE — Progress Notes (Signed)
No new changes noted today. The patient Name and DOB has been verified by phone today. 

## 2020-01-20 ENCOUNTER — Other Ambulatory Visit: Payer: PPO

## 2020-01-20 ENCOUNTER — Encounter: Payer: Self-pay | Admitting: Hematology and Oncology

## 2020-01-20 ENCOUNTER — Inpatient Hospital Stay (HOSPITAL_BASED_OUTPATIENT_CLINIC_OR_DEPARTMENT_OTHER): Payer: PPO | Admitting: Hematology and Oncology

## 2020-01-20 DIAGNOSIS — D472 Monoclonal gammopathy: Secondary | ICD-10-CM

## 2020-01-21 LAB — PROTEIN ELECTROPHORESIS, SERUM
A/G Ratio: 1.1 (ref 0.7–1.7)
Albumin ELP: 4 g/dL (ref 2.9–4.4)
Alpha-1-Globulin: 0.2 g/dL (ref 0.0–0.4)
Alpha-2-Globulin: 0.6 g/dL (ref 0.4–1.0)
Beta Globulin: 1 g/dL (ref 0.7–1.3)
Gamma Globulin: 1.9 g/dL — ABNORMAL HIGH (ref 0.4–1.8)
Globulin, Total: 3.7 g/dL (ref 2.2–3.9)
M-Spike, %: 0.8 g/dL — ABNORMAL HIGH
Total Protein ELP: 7.7 g/dL (ref 6.0–8.5)

## 2020-02-07 ENCOUNTER — Emergency Department
Admission: EM | Admit: 2020-02-07 | Discharge: 2020-02-07 | Disposition: A | Discharge status: Home / Self Care | Payer: PPO | Attending: Emergency Medicine | Admitting: Emergency Medicine

## 2020-02-07 ENCOUNTER — Other Ambulatory Visit: Payer: Self-pay

## 2020-02-07 ENCOUNTER — Encounter: Payer: Self-pay | Admitting: Emergency Medicine

## 2020-02-07 ENCOUNTER — Emergency Department: Payer: PPO

## 2020-02-07 DIAGNOSIS — R630 Anorexia: Secondary | ICD-10-CM | POA: Diagnosis not present

## 2020-02-07 DIAGNOSIS — U071 COVID-19: Secondary | ICD-10-CM | POA: Insufficient documentation

## 2020-02-07 DIAGNOSIS — R531 Weakness: Secondary | ICD-10-CM | POA: Insufficient documentation

## 2020-02-07 DIAGNOSIS — R110 Nausea: Secondary | ICD-10-CM | POA: Diagnosis not present

## 2020-02-07 DIAGNOSIS — R5381 Other malaise: Secondary | ICD-10-CM | POA: Diagnosis not present

## 2020-02-07 DIAGNOSIS — I10 Essential (primary) hypertension: Secondary | ICD-10-CM | POA: Diagnosis not present

## 2020-02-07 DIAGNOSIS — R509 Fever, unspecified: Secondary | ICD-10-CM | POA: Diagnosis not present

## 2020-02-07 DIAGNOSIS — R05 Cough: Secondary | ICD-10-CM | POA: Insufficient documentation

## 2020-02-07 DIAGNOSIS — E860 Dehydration: Secondary | ICD-10-CM | POA: Diagnosis not present

## 2020-02-07 LAB — CBC WITH DIFFERENTIAL/PLATELET
Abs Immature Granulocytes: 0.09 10*3/uL — ABNORMAL HIGH (ref 0.00–0.07)
Basophils Absolute: 0 10*3/uL (ref 0.0–0.1)
Basophils Relative: 0 %
Eosinophils Absolute: 0.1 10*3/uL (ref 0.0–0.5)
Eosinophils Relative: 1 %
HCT: 42.2 % (ref 39.0–52.0)
Hemoglobin: 14 g/dL (ref 13.0–17.0)
Immature Granulocytes: 1 %
Lymphocytes Relative: 17 %
Lymphs Abs: 1.4 10*3/uL (ref 0.7–4.0)
MCH: 30.3 pg (ref 26.0–34.0)
MCHC: 33.2 g/dL (ref 30.0–36.0)
MCV: 91.3 fL (ref 80.0–100.0)
Monocytes Absolute: 0.9 10*3/uL (ref 0.1–1.0)
Monocytes Relative: 11 %
Neutro Abs: 6 10*3/uL (ref 1.7–7.7)
Neutrophils Relative %: 70 %
Platelets: 400 10*3/uL (ref 150–400)
RBC: 4.62 MIL/uL (ref 4.22–5.81)
RDW: 12.2 % (ref 11.5–15.5)
Smear Review: NORMAL
WBC: 8.5 10*3/uL (ref 4.0–10.5)
nRBC: 0 % (ref 0.0–0.2)

## 2020-02-07 LAB — COMPREHENSIVE METABOLIC PANEL
ALT: 82 U/L — ABNORMAL HIGH (ref 0–44)
AST: 81 U/L — ABNORMAL HIGH (ref 15–41)
Albumin: 2.8 g/dL — ABNORMAL LOW (ref 3.5–5.0)
Alkaline Phosphatase: 59 U/L (ref 38–126)
Anion gap: 10 (ref 5–15)
BUN: 13 mg/dL (ref 8–23)
CO2: 26 mmol/L (ref 22–32)
Calcium: 8.9 mg/dL (ref 8.9–10.3)
Chloride: 98 mmol/L (ref 98–111)
Creatinine, Ser: 0.85 mg/dL (ref 0.61–1.24)
GFR calc Af Amer: >60 mL/min (ref >60)
GFR calc non Af Amer: >60 mL/min (ref >60)
Glucose, Bld: 122 mg/dL — ABNORMAL HIGH (ref 70–99)
Potassium: 5.1 mmol/L (ref 3.5–5.1)
Sodium: 134 mmol/L — ABNORMAL LOW (ref 135–145)
Total Bilirubin: 1.2 mg/dL (ref 0.3–1.2)
Total Protein: 7.6 g/dL (ref 6.5–8.1)

## 2020-02-07 LAB — PROCALCITONIN: Procalcitonin: <0.1 ng/mL

## 2020-02-07 LAB — TROPONIN I (HIGH SENSITIVITY): Troponin I (High Sensitivity): 2 ng/L (ref <18)

## 2020-02-07 LAB — LACTIC ACID, PLASMA: Lactic Acid, Venous: 1.5 mmol/L (ref 0.5–1.9)

## 2020-02-07 MED ORDER — ONDANSETRON 4 MG PO TBDP: 4.0000 mg | ORAL_TABLET | Freq: Three times a day (TID) | ORAL | 0 refills | Status: DC | PRN

## 2020-02-07 MED ORDER — SODIUM CHLORIDE 0.9 % IV BOLUS
1000.0000 mL | Freq: Once | INTRAVENOUS | Status: AC
  Administered 2020-02-07: 1000 mL via INTRAVENOUS

## 2020-02-07 MED ORDER — PSEUDOEPH-BROMPHEN-DM 30-2-10 MG/5ML PO SYRP: 10.0000 mL | ORAL_SOLUTION | Freq: Four times a day (QID) | ORAL | 0 refills | Status: DC | PRN

## 2020-02-07 NOTE — ED Provider Notes (Signed)
[facility] Emergency Department Provider Note  ____________________________________________  Time seen: Approximately 6:12 PM  I have reviewed the triage vital signs and the nursing notes.   HISTORY  Chief Complaint Weakness    HPI Dylan Young is a 74 y.o. male who presents the emergency department complaining of generalized weakness, malaise, coughing in the setting of COVID-19 infection.  Patient is 14 days into symptoms of his COVID-19 infection.  Patient denies any headache, neck pain or stiffness, chest pain, significant shortness of breath, abdominal pain.  Patient states that he is nauseated, feeling very weak.  He is still able to drink and eat but states that he has a reduced appetite.  Patient is primarily concerned due to the fact that he has not improved given this duration of time.  Patient has a medical history as described below with BPH, glaucoma, hyperlipidemia, osteoarthritis.          Past Medical History:  Diagnosis Date  . Arthritis   . Benign neoplasm of colon   . Benign prostatic hypertrophy with lower urinary tract symptoms (LUTS)   . Dysphagia   . ED (erectile dysfunction)   . Glaucoma   . Heart burn   . History of stomach ulcers   . Hyperlipidemia   . Hypogonadism male   . Kidney stones   . Obesity   . Osteoarthritis   . Tubular adenoma of colon   . Urinary frequency   . Urinary frequency   . Urinary urgency   . Vitamin D deficiency     Patient Active Problem List   Diagnosis Date Noted  . 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 Glaucoma 05/26/2015  . Arthritis, degenerative 05/26/2015  . Tubular adenoma of colon 05/26/2015  . Benign prostatic hyperplasia with urinary obstruction 03/08/2015    Past Surgical History:  Procedure Laterality Date  . APPENDECTOMY    . CATARACT EXTRACTION     Right (1996); Left (2007)  . COLONOSCOPY WITH PROPOFOL N/A 05/08/2016   Procedure:  COLONOSCOPY WITH PROPOFOL;  Surgeon: Lollie Sails, MD;  Location: [facility] ENDOSCOPY;  Service: Endoscopy;  Laterality: N/A;  . EXTRACORPOREAL SHOCK WAVE LITHOTRIPSY Right 05/27/2015   Procedure: EXTRACORPOREAL SHOCK WAVE LITHOTRIPSY (ESWL);  Surgeon: Hollice Espy, MD;  Location: [facility] ORS;  Service: Urology;  Laterality: Right;  . EYE SURGERY    . LITHOTRIPSY    . RETINAL DETACHMENT SURGERY      Prior to Admission medications   Medication Sig Start Date End Date Taking? Authorizing Provider  bromfenac (XIBROM) 0.09 % ophthalmic solution Apply 1 drop to eye 2 (two) times daily.     [provider]  brompheniramine-pseudoephedrine-DM 30-2-10 MG/5ML syrup Take 10 mLs by mouth 4 (four) times daily as needed. 02/07/20   Kaysan Peixoto, Charline Bills, PA-C  Cholecalciferol (VITAMIN D-3) 1000 UNITS CAPS Take 1 capsule by mouth daily.    [provider]  Dorzolamide HCl-Timolol Mal PF 22.3-6.8 MG/ML SOLN Place 1 drop into both eyes 2 (two) times daily.    [provider]  latanoprost (XALATAN) 0.005 % ophthalmic solution Apply 1 drop to eye at bedtime.  01/10/08   [provider]  Multiple Vitamins-Minerals (MULTIVITAMIN ADULT PO) Take 1 tablet by mouth daily.    [provider]  ondansetron (ZOFRAN-ODT) 4 MG disintegrating tablet Take 1 tablet (4 mg total) by mouth every 8 (eight) hours as needed for nausea or vomiting. 02/07/20   Russell Engelstad, Charline Bills, PA-C  Vit B6-Vit B12-Omega  3 Acids (VITAMIN B PLUS+ PO) Take 1 tablet by mouth daily.    [provider]  vitamin C (ASCORBIC ACID) 500 MG tablet Take 500 mg by mouth 4 (four) times daily.    [provider]  vitamin E 400 UNIT capsule Take 400 Units by mouth daily.    [provider]    Allergies Patient has no known allergies.  Family History  Problem Relation Age of Onset  . Prostate cancer Father   . Alzheimer's disease Father   . Diabetes Mother   . Stroke Mother     Social  History Social History   Tobacco Use  . Smoking status: Former Research scientist (life sciences)  . Smokeless tobacco: Former Systems developer    Quit date: 05/26/1975  Substance Use Topics  . Alcohol use: Yes    Alcohol/week: 1.0 standard drinks    Types: 1 Standard drinks or equivalent per week    Comment: occasional   . Drug use: No     Review of Systems  Constitutional: No fever/chills.  Positive for generalized weakness Eyes: No visual changes. No discharge ENT: No upper respiratory complaints. Cardiovascular: no chest pain. Respiratory: Positive cough.  Mild SOB. Gastrointestinal: No abdominal pain.  No nausea, no vomiting.  No diarrhea.  No constipation. Genitourinary: Negative for dysuria. No hematuria Musculoskeletal: Negative for musculoskeletal pain. Skin: Negative for rash, abrasions, lacerations, ecchymosis. Neurological: Negative for headaches, focal weakness or numbness. 10-point ROS otherwise negative.  ____________________________________________   PHYSICAL EXAM:  VITAL SIGNS: ED Triage Vitals  Enc Vitals Group     BP 02/07/20 1544 (!) 158/90     Pulse Rate 02/07/20 1544 91     Resp 02/07/20 1544 (!) 23     Temp 02/07/20 1544 98.4 F (36.9 C)     Temp Source 02/07/20 1544 Oral     SpO2 02/07/20 1544 98 %     Weight 02/07/20 1546 158 lb (71.7 kg)     Height 02/07/20 1546 5\' 6"  (1.676 m)     Head Circumference --      Peak Flow --      Pain Score 02/07/20 1545 6     Pain Loc --      Pain Edu? --      Excl. in [place]? --      Constitutional: Alert and oriented.  Mildly ill-appearing appearing but in no acute distress. Eyes: Conjunctivae are normal. PERRL. EOMI. Head: Atraumatic. ENT:      Ears:       Nose: No congestion/rhinnorhea.      Mouth/Throat: Mucous membranes are moist.  Neck: No stridor.  Neck is supple full range of motion Hematological/Lymphatic/Immunilogical: No cervical lymphadenopathy. Cardiovascular: Normal rate, regular rhythm. Normal S1 and S2.  Good peripheral  circulation. Respiratory: Normal respiratory effort without tachypnea or retractions. Lungs CTAB. Good air entry to the bases with no decreased or absent breath sounds. Gastrointestinal: Bowel sounds 4 quadrants. Soft and nontender to palpation. No guarding or rigidity. No palpable masses. No distention. No CVA tenderness. Musculoskeletal: Full range of motion to all extremities. No gross deformities appreciated. Neurologic:  Normal speech and language. No gross focal neurologic deficits are appreciated.  Skin:  Skin is warm, dry and intact. No rash noted. Psychiatric: Mood and affect are normal. Speech and behavior are normal. Patient exhibits appropriate insight and judgement.   ____________________________________________   LABS (all labs ordered are listed, but only abnormal results are displayed)  Labs Reviewed  COMPREHENSIVE METABOLIC PANEL - Abnormal; Notable  for the following components:      Result Value   Sodium 134 (*)    Glucose, Bld 122 (*)    Albumin 2.8 (*)    AST 81 (*)    ALT 82 (*)    All other components within normal limits  CBC WITH DIFFERENTIAL/PLATELET - Abnormal; Notable for the following components:   Abs Immature Granulocytes 0.09 (*)    All other components within normal limits  LACTIC ACID, PLASMA  PROCALCITONIN  URINALYSIS, COMPLETE (UACMP) WITH MICROSCOPIC  LACTIC ACID, PLASMA  TROPONIN I (HIGH SENSITIVITY)  TROPONIN I (HIGH SENSITIVITY)   ____________________________________________  EKG   ____________________________________________  RADIOLOGY I personally viewed and evaluated these images as part of my medical decision making, as well as reviewing the written report by the radiologist.  DG Chest 1 View  Result Date: 02/07/2020 CLINICAL DATA:  Weakness fever short of breath, COVID positive EXAM: CHEST  1 VIEW COMPARISON:  None. FINDINGS: Bilateral patchy ground-glass opacities and mild consolidations. No pleural effusion. Normal heart  size. Aortic atherosclerosis. No pneumothorax. IMPRESSION: Bilateral patchy ground-glass opacities and mild consolidations consistent with pneumonia, and given clinical history of COVID positivity. Electronically Signed   By: Donavan Foil M.D.   On: 02/07/2020 16:00    ____________________________________________    PROCEDURES  Procedure(s) performed:    Procedures    Medications  sodium chloride 0.9 % bolus 1,000 mL (0 mLs Intravenous Stopped 02/07/20 1818)     ____________________________________________   INITIAL IMPRESSION / ASSESSMENT AND PLAN / ED COURSE  Pertinent labs & imaging results that were available during my care of the patient were reviewed by me and considered in my medical decision making (see chart for details).  Review of the [place] CSRS was performed in accordance of the [place] prior to dispensing any controlled drugs.           Patient's diagnosis is consistent with COVID-19, weakness.  Patient presented to emergency department concern over ongoing symptoms for COVID-19.  Patient was diagnosed 2 weeks ago and is 14 days into his symptoms.  Patient was primarily concerned given his generalized weakness, the fact that he had not improved as of yet.  There was no other significant complaints.  Exam was overall reassuring.  Labs are reassuring at this time.  Patient does have mildly elevated AST and ALT and is advised to follow-up with primary care once he improves symptomatically to ensure resolution of LFTs.  I suspect that these are secondary to viral infection, mild dehydration.  Patient has no history of liver problems, no history of elevated LFTs.  However patient has no abdominal pain, no tenderness to palpation in the right upper quadrant, no hepatomegaly.  Otherwise, patient was given fluids, antiemetics and will be prescribed Zofran and cough medication for at home use.  I have instructed the patient to continue to hydrate well, eat as much as possible.   Concerning signs and symptoms are discussed with the patient.  However at this time patient is stable for discharge and does not require admission for management of his COVID-19.  Patient is maintained good oxygen saturation on room air..  Follow-up primary care as needed.  Patient is given ED precautions to return to the ED for any worsening or new symptoms.     ____________________________________________  FINAL CLINICAL IMPRESSION(S) / ED DIAGNOSES  Final diagnoses:  COVID-19  Weakness      NEW MEDICATIONS STARTED DURING THIS VISIT:  ED Discharge Orders  Ordered    ondansetron (ZOFRAN-ODT) 4 MG disintegrating tablet  Every 8 hours PRN     02/07/20 1830    brompheniramine-pseudoephedrine-DM 30-2-10 MG/5ML syrup  4 times daily PRN     02/07/20 1830              This chart was dictated using voice recognition software/Dragon. Despite best efforts to proofread, errors can occur which can change the meaning. Any change was purely unintentional.    Darletta Moll, PA-C 02/07/20 1840    Nance Pear, MD 02/07/20 2045

## 2020-02-07 NOTE — ED Triage Notes (Signed)
Pt to ER states positive CoVid 14 days ago, states he is "getting weaker and sicker."  Pt states cough productive of white mucus.  Pt reports intermittent fever.  States no acute change today, just thought he would be better by now.

## 2020-02-07 NOTE — ED Notes (Signed)
Peripheral IV discontinued. Catheter intact. No signs of infiltration or redness. Gauze applied to IV site.   Discharge instructions reviewed with patient. Questions fielded by this RN. Patient verbalizes understanding of instructions. Patient discharged home in stable condition per funke. No acute distress noted at time of discharge.   Pt wheeled to wife's car after helping pt call

## 2020-03-24 DIAGNOSIS — H401134 Primary open-angle glaucoma, bilateral, indeterminate stage: Secondary | ICD-10-CM | POA: Diagnosis not present

## 2020-04-14 DIAGNOSIS — Z20822 Contact with and (suspected) exposure to covid-19: Secondary | ICD-10-CM | POA: Diagnosis not present

## 2020-04-14 DIAGNOSIS — H401134 Primary open-angle glaucoma, bilateral, indeterminate stage: Secondary | ICD-10-CM | POA: Diagnosis not present

## 2020-04-14 DIAGNOSIS — Z01812 Encounter for preprocedural laboratory examination: Secondary | ICD-10-CM | POA: Diagnosis not present

## 2020-04-20 ENCOUNTER — Other Ambulatory Visit: Payer: Self-pay

## 2020-04-20 ENCOUNTER — Inpatient Hospital Stay: Payer: PPO | Attending: Hematology and Oncology

## 2020-04-20 DIAGNOSIS — D472 Monoclonal gammopathy: Secondary | ICD-10-CM | POA: Diagnosis not present

## 2020-04-20 LAB — CBC WITH DIFFERENTIAL/PLATELET
Abs Immature Granulocytes: 0.03 10*3/uL (ref 0.00–0.07)
Basophils Absolute: 0 10*3/uL (ref 0.0–0.1)
Basophils Relative: 0 %
Eosinophils Absolute: 0.1 10*3/uL (ref 0.0–0.5)
Eosinophils Relative: 1 %
HCT: 42.1 % (ref 39.0–52.0)
Hemoglobin: 14.4 g/dL (ref 13.0–17.0)
Immature Granulocytes: 0 %
Lymphocytes Relative: 30 %
Lymphs Abs: 2.2 10*3/uL (ref 0.7–4.0)
MCH: 31 pg (ref 26.0–34.0)
MCHC: 34.2 g/dL (ref 30.0–36.0)
MCV: 90.5 fL (ref 80.0–100.0)
Monocytes Absolute: 0.5 10*3/uL (ref 0.1–1.0)
Monocytes Relative: 7 %
Neutro Abs: 4.4 10*3/uL (ref 1.7–7.7)
Neutrophils Relative %: 62 %
Platelets: 262 10*3/uL (ref 150–400)
RBC: 4.65 MIL/uL (ref 4.22–5.81)
RDW: 13.1 % (ref 11.5–15.5)
WBC: 7.1 10*3/uL (ref 4.0–10.5)
nRBC: 0 % (ref 0.0–0.2)

## 2020-04-20 LAB — BASIC METABOLIC PANEL
Anion gap: 8 (ref 5–15)
BUN: 24 mg/dL — ABNORMAL HIGH (ref 8–23)
CO2: 25 mmol/L (ref 22–32)
Calcium: 9.5 mg/dL (ref 8.9–10.3)
Chloride: 102 mmol/L (ref 98–111)
Creatinine, Ser: 1.03 mg/dL (ref 0.61–1.24)
GFR calc Af Amer: >60 mL/min (ref >60)
GFR calc non Af Amer: >60 mL/min (ref >60)
Glucose, Bld: 144 mg/dL — ABNORMAL HIGH (ref 70–99)
Potassium: 3.7 mmol/L (ref 3.5–5.1)
Sodium: 135 mmol/L (ref 135–145)

## 2020-04-21 LAB — PROTEIN ELECTROPHORESIS, SERUM
A/G Ratio: 1 (ref 0.7–1.7)
Albumin ELP: 3.9 g/dL (ref 2.9–4.4)
Alpha-1-Globulin: 0.2 g/dL (ref 0.0–0.4)
Alpha-2-Globulin: 0.6 g/dL (ref 0.4–1.0)
Beta Globulin: 1.1 g/dL (ref 0.7–1.3)
Gamma Globulin: 1.9 g/dL — ABNORMAL HIGH (ref 0.4–1.8)
Globulin, Total: 3.8 g/dL (ref 2.2–3.9)
M-Spike, %: 0.7 g/dL — ABNORMAL HIGH
Total Protein ELP: 7.7 g/dL (ref 6.0–8.5)

## 2020-05-01 DIAGNOSIS — Z01812 Encounter for preprocedural laboratory examination: Secondary | ICD-10-CM | POA: Diagnosis not present

## 2020-05-01 DIAGNOSIS — Z20822 Contact with and (suspected) exposure to covid-19: Secondary | ICD-10-CM | POA: Diagnosis not present

## 2020-05-04 DIAGNOSIS — D649 Anemia, unspecified: Secondary | ICD-10-CM | POA: Diagnosis not present

## 2020-05-04 DIAGNOSIS — N138 Other obstructive and reflux uropathy: Secondary | ICD-10-CM | POA: Diagnosis not present

## 2020-05-04 DIAGNOSIS — N401 Enlarged prostate with lower urinary tract symptoms: Secondary | ICD-10-CM | POA: Diagnosis not present

## 2020-05-04 DIAGNOSIS — Z87891 Personal history of nicotine dependence: Secondary | ICD-10-CM | POA: Diagnosis not present

## 2020-05-04 DIAGNOSIS — H401113 Primary open-angle glaucoma, right eye, severe stage: Secondary | ICD-10-CM | POA: Diagnosis not present

## 2020-05-04 DIAGNOSIS — Z8616 Personal history of COVID-19: Secondary | ICD-10-CM | POA: Diagnosis not present

## 2020-05-11 DIAGNOSIS — R030 Elevated blood-pressure reading, without diagnosis of hypertension: Secondary | ICD-10-CM | POA: Diagnosis not present

## 2020-05-11 DIAGNOSIS — Z0000 Encounter for general adult medical examination without abnormal findings: Secondary | ICD-10-CM | POA: Diagnosis not present

## 2020-05-11 DIAGNOSIS — D472 Monoclonal gammopathy: Secondary | ICD-10-CM | POA: Diagnosis not present

## 2020-05-11 DIAGNOSIS — H409 Unspecified glaucoma: Secondary | ICD-10-CM | POA: Diagnosis not present

## 2020-05-11 DIAGNOSIS — N138 Other obstructive and reflux uropathy: Secondary | ICD-10-CM | POA: Diagnosis not present

## 2020-05-11 DIAGNOSIS — R7309 Other abnormal glucose: Secondary | ICD-10-CM | POA: Diagnosis not present

## 2020-05-11 DIAGNOSIS — N401 Enlarged prostate with lower urinary tract symptoms: Secondary | ICD-10-CM | POA: Diagnosis not present

## 2020-05-11 DIAGNOSIS — E7800 Pure hypercholesterolemia, unspecified: Secondary | ICD-10-CM | POA: Diagnosis not present

## 2020-05-11 DIAGNOSIS — D126 Benign neoplasm of colon, unspecified: Secondary | ICD-10-CM | POA: Diagnosis not present

## 2020-07-16 ENCOUNTER — Encounter: Payer: Self-pay | Admitting: Hematology and Oncology

## 2020-07-16 ENCOUNTER — Other Ambulatory Visit: Payer: Self-pay

## 2020-07-16 NOTE — Progress Notes (Signed)
No new changes noted today. The patient name and DOB has been verified by phone today. 

## 2020-07-18 NOTE — Progress Notes (Signed)
[facility]  [address] [place], [place] [address] Phone: [phone]  Fax: [phone]   Clinic Day:  07/19/2020  Referring physician: Ezequiel Kayser, MD  Chief Complaint: Dylan Young is a 74 y.o. male with a monoclonal gammopathyof unknown significance (MGUS) who is seen for a 6 month assessment.   HPI: The patient was last seen in the medical oncology clinic on 01/20/2020 via telemedicine. At that time, he felt "ok".  He had arthritis. Hematocrit was 44.7, hemoglobin 15.1, platelets 258,000, WBC 7,500. Sodium was 134. Total protein was 8.6. M spike was 0.8%.  The patient had COVID-19 in 01/2020. The patient had fevers over 101 everyday and his temperature got as high as 104.3. He did not have any breathing problems. His symptoms lasted for two and a half weeks.  The patient underwent eye surgery (XEN implant) on 05/04/2020 with Dr. Donnetta Hail.  Labs on 04/20/2020 revealed a hematocrit 42.1, hemoglobin 14.4, platelets 262,000, WBC 7,100.  M spike was 0.7 gm/dL.  During the interim, he has been "great." He has recovered from the COVID-19 infection. His energy is back to normal; he walked 4 miles this morning. He reports that he lost about 15 lbs while he had COVID, but has gained most of it back. His arthritis is stable.   Past Medical History:  Diagnosis Date  . Arthritis   . Benign neoplasm of colon   . Benign prostatic hypertrophy with lower urinary tract symptoms (LUTS)   . Dysphagia   . ED (erectile dysfunction)   . Glaucoma   . Heart burn   . History of stomach ulcers   . Hyperlipidemia   . Hypogonadism male   . Kidney stones   . Obesity   . Osteoarthritis   . Tubular adenoma of colon   . Urinary frequency   . Urinary frequency   . Urinary urgency   . Vitamin D deficiency     Past Surgical History:  Procedure Laterality Date  . APPENDECTOMY    . CATARACT EXTRACTION     Right (1996); Left (2007)  . COLONOSCOPY WITH  PROPOFOL N/A 05/08/2016   Procedure: COLONOSCOPY WITH PROPOFOL;  Surgeon: Lollie Sails, MD;  Location: [facility] ENDOSCOPY;  Service: Endoscopy;  Laterality: N/A;  . EXTRACORPOREAL SHOCK WAVE LITHOTRIPSY Right 05/27/2015   Procedure: EXTRACORPOREAL SHOCK WAVE LITHOTRIPSY (ESWL);  Surgeon: Hollice Espy, MD;  Location: [facility] ORS;  Service: Urology;  Laterality: Right;  . EYE SURGERY    . LITHOTRIPSY    . RETINAL DETACHMENT SURGERY      Family History  Problem Relation Age of Onset  . Prostate cancer Father   . Alzheimer's disease Father   . Diabetes Mother   . Stroke Mother     Social History:  reports that he has quit smoking. He quit smokeless tobacco use about 45 years ago. He reports current alcohol use of about 1.0 standard drink of alcohol per week. He reports that he does not use drugs. He stopped smoking 45 years ago. He rarely drinks alcohol. He denies any exposure to radiation or toxins. 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 The patient lives in [place]. He likes to garden. The patient is alone today.  Allergies: No Known Allergies  Current Medications: Current Outpatient Medications  Medication Sig Dispense Refill  . bromfenac (XIBROM) 0.09 % ophthalmic solution Apply 1 drop to eye 2 (two) times daily.     . Cholecalciferol (VITAMIN D-3) 1000 UNITS CAPS  Take 2 capsules by mouth daily.     . Dorzolamide HCl-Timolol Mal PF 22.3-6.8 MG/ML SOLN Place 1 drop into both eyes 2 (two) times daily.    Marland Kitchen latanoprost (XALATAN) 0.005 % ophthalmic solution Apply 1 drop to eye at bedtime.     . Multiple Vitamins-Minerals (MULTIVITAMIN ADULT PO) Take 1 tablet by mouth daily.    . Vit B6-Vit B12-Omega 3 Acids (VITAMIN B PLUS+ PO) Take 1 tablet by mouth daily.    . vitamin C (ASCORBIC ACID) 500 MG tablet Take 2,000 mg by mouth daily.     . vitamin E 400 UNIT capsule Take 400 Units by mouth daily.    . brompheniramine-pseudoephedrine-DM 30-2-10 MG/5ML syrup Take 10  mLs by mouth 4 (four) times daily as needed. (Patient not taking: Reported on 07/16/2020) 200 mL 0  . ondansetron (ZOFRAN-ODT) 4 MG disintegrating tablet Take 1 tablet (4 mg total) by mouth every 8 (eight) hours as needed for nausea or vomiting. (Patient not taking: Reported on 07/16/2020) 20 tablet 0   No current facility-administered medications for this visit.    Review of Systems  Constitutional: Negative.  Negative for chills, diaphoresis, fever, malaise/fatigue and weight loss.       Feels "great".  Staying active.  HENT: Negative.  Negative for congestion, ear pain, hearing loss, nosebleeds, sinus pain and sore throat.   Eyes: Negative for double vision and pain.       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  Negative for cough, shortness of breath and wheezing.   Cardiovascular: Negative.  Negative for chest pain, palpitations and leg swelling.  Gastrointestinal: Negative.  Negative for abdominal pain, blood in stool, constipation, diarrhea, heartburn, melena, nausea and vomiting.  Genitourinary: Negative.  Negative for dysuria, frequency, hematuria and urgency.  Musculoskeletal: Positive for back pain (lower back arthritis, stable) and joint pain (shoulder and knee arthritis, stable). Negative for myalgias and neck pain.  Skin: Negative.  Negative for rash.  Neurological: Negative.  Negative for dizziness, tingling, sensory change, speech change, focal weakness, weakness and headaches.  Endo/Heme/Allergies: Negative.  Does not bruise/bleed easily.  Psychiatric/Behavioral: Negative.  Negative for depression and memory loss. The patient is not nervous/anxious and does not have insomnia.   All other systems reviewed and are negative.  Performance status (ECOG): 0  Pulse 64, temperature (!) 96.2 F (35.7 C), temperature source Tympanic, resp. rate 16, weight 159 lb 13.3 oz (72.5 kg), SpO2 100 %.   Physical Exam Vitals and nursing note reviewed.   Constitutional:      General: He is not in acute distress.    Appearance: He is not diaphoretic.  HENT:     Head: Normocephalic and atraumatic.     Comments: Short gray hair.    Mouth/Throat:     Mouth: Mucous membranes are moist.     Pharynx: Oropharynx is clear.  Eyes:     Extraocular Movements: Extraocular movements intact.     Pupils: Pupils are equal, round, and reactive to light.     Comments: Slightly injected eyes.  Cardiovascular:     Rate and Rhythm: Normal rate and regular rhythm.     Heart sounds: Normal heart sounds. No murmur heard.   Pulmonary:     Effort: Pulmonary effort is normal. No respiratory distress.     Breath sounds: Normal breath sounds. No wheezing or rales.  Chest:     Chest wall: No tenderness.  Abdominal:     General: Bowel sounds  are normal. There is no distension.     Palpations: Abdomen is soft. There is no mass.     Tenderness: There is no abdominal tenderness. There is no guarding or rebound.  Musculoskeletal:        General: No swelling or tenderness. Normal range of motion.     Cervical back: Normal range of motion and neck supple.  Lymphadenopathy:     Head:     Right side of head: No preauricular, posterior auricular or occipital adenopathy.     Left side of head: No preauricular, posterior auricular or occipital adenopathy.     Cervical: No cervical adenopathy.     Upper Body:     Right upper body: No supraclavicular or axillary adenopathy.     Left upper body: No supraclavicular or axillary adenopathy.     Lower Body: No right inguinal adenopathy. No left inguinal adenopathy.  Skin:    General: Skin is warm and dry.  Neurological:     Mental Status: He is alert and oriented to person, place, and time. Mental status is at baseline.  Psychiatric:        Mood and Affect: Mood and affect normal.        Behavior: Behavior normal.        Thought Content: Thought content normal.        Cognition and Memory: Memory normal.         Judgment: Judgment normal.     Appointment on 07/19/2020  Component Date Value Ref Range Status  . Sodium 07/19/2020 138  135 - 145 mmol/L Final  . Potassium 07/19/2020 4.1  3.5 - 5.1 mmol/L Final  . Chloride 07/19/2020 106  98 - 111 mmol/L Final  . CO2 07/19/2020 25  22 - 32 mmol/L Final  . Glucose, Bld 07/19/2020 81  70 - 99 mg/dL Final   Glucose reference range applies only to samples taken after fasting for at least 8 hours.  . BUN 07/19/2020 17  8 - 23 mg/dL Final  . Creatinine, Ser 07/19/2020 0.83  0.61 - 1.24 mg/dL Final  . Calcium 07/19/2020 8.8* 8.9 - 10.3 mg/dL Final  . Total Protein 07/19/2020 7.6  6.5 - 8.1 g/dL Final  . Albumin 07/19/2020 4.0  3.5 - 5.0 g/dL Final  . AST 07/19/2020 17  15 - 41 U/L Final  . ALT 07/19/2020 17  0 - 44 U/L Final  . Alkaline Phosphatase 07/19/2020 54  38 - 126 U/L Final  . Total Bilirubin 07/19/2020 0.9  0.3 - 1.2 mg/dL Final  . GFR calc non Af Amer 07/19/2020 >60  >60 mL/min Final  . GFR calc Af Amer 07/19/2020 >60  >60 mL/min Final  . Anion gap 07/19/2020 7  5 - 15 Final   Performed at [facility], [address] [place], [place] [address]  . WBC 07/19/2020 7.4  4.0 - 10.5 K/uL Final  . RBC 07/19/2020 4.41  4.22 - 5.81 MIL/uL Final  . Hemoglobin 07/19/2020 13.8  13.0 - 17.0 g/dL Final  . HCT 07/19/2020 40.7  39 - 52 % Final  . MCV 07/19/2020 92.3  80.0 - 100.0 fL Final  . MCH 07/19/2020 31.3  26.0 - 34.0 pg Final  . MCHC 07/19/2020 33.9  30.0 - 36.0 g/dL Final  . RDW 07/19/2020 12.7  11.5 - 15.5 % Final  . Platelets 07/19/2020 249  150 - 400 K/uL Final  . nRBC 07/19/2020 0.0  0.0 - 0.2 % Final  .  Neutrophils Relative % 07/19/2020 64  % Final  . Neutro Abs 07/19/2020 4.7  1.7 - 7.7 K/uL Final  . Lymphocytes Relative 07/19/2020 28  % Final  . Lymphs Abs 07/19/2020 2.0  0.7 - 4.0 K/uL Final  . Monocytes Relative 07/19/2020 7  % Final  . Monocytes Absolute 07/19/2020 0.5  0 - 1 K/uL Final  . Eosinophils Relative 07/19/2020  1  % Final  . Eosinophils Absolute 07/19/2020 0.1  0 - 0 K/uL Final  . Basophils Relative 07/19/2020 0  % Final  . Basophils Absolute 07/19/2020 0.0  0 - 0 K/uL Final  . Immature Granulocytes 07/19/2020 0  % Final  . Abs Immature Granulocytes 07/19/2020 0.02  0.00 - 0.07 K/uL Final   Performed at [facility], [address] [place], [place] [address]    Assessment:  Dylan Young is a 74 y.o. male with a monoclonal gammopathy of unknown significance (MGUS).  M-protein is IgG with lambda light chain specificity.  Labs on 05/18/2020revealed a hemoglobin 14.9, hematocrit 44.3, platelets 260,000, and WBC 6200 ([place] 3510). Ferritin was134.Creatinine was 1.0. Calcium was 9.5. M-spikewas 0.9gm/dL.  Work-up on 06/09/2019 revealed a 0.8 gm/dL IgG with lambda monoclonal protein with light chain specificty.  Lambda light chain were 31.3 (ratio 0.54).  LDH and beta-2 microglobulin were normal. IgG was 1,976. 24hr UPEP was negative.   M-spike has been followed (gm/dL): 0.9 on 05/12/2019, 0.8 on 06/09/2019, 0.8 on 01/19/2020, 0.7 on 04/20/2020 and 0.8 on 07/19/2020.  The patient had COVID-19 in 01/2020.  Symptomatically, he is feeling better.  Energy level is back to normal.  He is more active.  He lost weight but is gaining it back.  Plan: 1.   Labs today: CBC with diff, CMP, SPEP, free light chain assay. 2.   Monoclonal gammopathy of unknown significance (MGUS)  Clinically, he continues to do well. M spike is stable at 0.8gm/dL.  Patient denies any current symptoms.  Exam is unremarkable.  Continue to monitor every 3 months. 3.   RN to call patient with today's lab results. 4.   RTC in 6 months for MD assessment and labs (CBC with differential, CMP, SPEP, FLCA).  I discussed the assessment and treatment plan with the patient.  The patient was provided an opportunity to ask questions and all were answered.  The patient agreed with the plan and demonstrated  an understanding of the instructions.  The patient was advised to call back if the symptoms worsen or if the condition fails to improve as anticipated.   Lequita Asal, MD, PhD    07/19/2020, 3:33 PM  I, Mirian Mo Tufford, am acting as Education administrator for [employer]. Mike Gip, MD, PhD.  I, Melissa C. Mike Gip, MD, have reviewed the above documentation for accuracy and completeness, and I agree with the above.

## 2020-07-19 ENCOUNTER — Inpatient Hospital Stay: Payer: PPO

## 2020-07-19 ENCOUNTER — Other Ambulatory Visit: Payer: Self-pay

## 2020-07-19 ENCOUNTER — Encounter: Payer: Self-pay | Admitting: Hematology and Oncology

## 2020-07-19 ENCOUNTER — Inpatient Hospital Stay: Payer: PPO | Attending: Hematology and Oncology | Admitting: Hematology and Oncology

## 2020-07-19 VITALS — HR 64 | Temp 96.2°F | Resp 16 | Wt 159.8 lb

## 2020-07-19 DIAGNOSIS — Z8616 Personal history of COVID-19: Secondary | ICD-10-CM | POA: Insufficient documentation

## 2020-07-19 DIAGNOSIS — D472 Monoclonal gammopathy: Secondary | ICD-10-CM

## 2020-07-19 DIAGNOSIS — Z87891 Personal history of nicotine dependence: Secondary | ICD-10-CM | POA: Diagnosis not present

## 2020-07-19 LAB — COMPREHENSIVE METABOLIC PANEL
ALT: 17 U/L (ref 0–44)
AST: 17 U/L (ref 15–41)
Albumin: 4 g/dL (ref 3.5–5.0)
Alkaline Phosphatase: 54 U/L (ref 38–126)
Anion gap: 7 (ref 5–15)
BUN: 17 mg/dL (ref 8–23)
CO2: 25 mmol/L (ref 22–32)
Calcium: 8.8 mg/dL — ABNORMAL LOW (ref 8.9–10.3)
Chloride: 106 mmol/L (ref 98–111)
Creatinine, Ser: 0.83 mg/dL (ref 0.61–1.24)
GFR calc Af Amer: >60 mL/min (ref >60)
GFR calc non Af Amer: >60 mL/min (ref >60)
Glucose, Bld: 81 mg/dL (ref 70–99)
Potassium: 4.1 mmol/L (ref 3.5–5.1)
Sodium: 138 mmol/L (ref 135–145)
Total Bilirubin: 0.9 mg/dL (ref 0.3–1.2)
Total Protein: 7.6 g/dL (ref 6.5–8.1)

## 2020-07-19 LAB — CBC WITH DIFFERENTIAL/PLATELET
Abs Immature Granulocytes: 0.02 10*3/uL (ref 0.00–0.07)
Basophils Absolute: 0 10*3/uL (ref 0.0–0.1)
Basophils Relative: 0 %
Eosinophils Absolute: 0.1 10*3/uL (ref 0.0–0.5)
Eosinophils Relative: 1 %
HCT: 40.7 % (ref 39.0–52.0)
Hemoglobin: 13.8 g/dL (ref 13.0–17.0)
Immature Granulocytes: 0 %
Lymphocytes Relative: 28 %
Lymphs Abs: 2 10*3/uL (ref 0.7–4.0)
MCH: 31.3 pg (ref 26.0–34.0)
MCHC: 33.9 g/dL (ref 30.0–36.0)
MCV: 92.3 fL (ref 80.0–100.0)
Monocytes Absolute: 0.5 10*3/uL (ref 0.1–1.0)
Monocytes Relative: 7 %
Neutro Abs: 4.7 10*3/uL (ref 1.7–7.7)
Neutrophils Relative %: 64 %
Platelets: 249 10*3/uL (ref 150–400)
RBC: 4.41 MIL/uL (ref 4.22–5.81)
RDW: 12.7 % (ref 11.5–15.5)
WBC: 7.4 10*3/uL (ref 4.0–10.5)
nRBC: 0 % (ref 0.0–0.2)

## 2020-07-20 LAB — KAPPA/LAMBDA LIGHT CHAINS
Kappa free light chain: 18.5 mg/L (ref 3.3–19.4)
Kappa, lambda light chain ratio: 0.77 (ref 0.26–1.65)
Lambda free light chains: 23.9 mg/L (ref 5.7–26.3)

## 2020-07-21 LAB — PROTEIN ELECTROPHORESIS, SERUM
A/G Ratio: 1 (ref 0.7–1.7)
Albumin ELP: 3.6 g/dL (ref 2.9–4.4)
Alpha-1-Globulin: 0.2 g/dL (ref 0.0–0.4)
Alpha-2-Globulin: 0.5 g/dL (ref 0.4–1.0)
Beta Globulin: 1 g/dL (ref 0.7–1.3)
Gamma Globulin: 1.8 g/dL (ref 0.4–1.8)
Globulin, Total: 3.5 g/dL (ref 2.2–3.9)
M-Spike, %: 0.8 g/dL — ABNORMAL HIGH
Total Protein ELP: 7.1 g/dL (ref 6.0–8.5)

## 2020-09-01 DIAGNOSIS — H401134 Primary open-angle glaucoma, bilateral, indeterminate stage: Secondary | ICD-10-CM | POA: Diagnosis not present

## 2020-09-13 ENCOUNTER — Other Ambulatory Visit: Payer: Self-pay

## 2020-09-13 ENCOUNTER — Ambulatory Visit (INDEPENDENT_AMBULATORY_CARE_PROVIDER_SITE_OTHER): Payer: PPO

## 2020-09-13 ENCOUNTER — Ambulatory Visit
Admission: EM | Admit: 2020-09-13 | Discharge: 2020-09-13 | Disposition: A | Discharge status: Home / Self Care | Payer: PPO | Attending: Family Medicine | Admitting: Family Medicine

## 2020-09-13 DIAGNOSIS — R05 Cough: Secondary | ICD-10-CM | POA: Diagnosis not present

## 2020-09-13 DIAGNOSIS — Z87891 Personal history of nicotine dependence: Secondary | ICD-10-CM | POA: Insufficient documentation

## 2020-09-13 DIAGNOSIS — E559 Vitamin D deficiency, unspecified: Secondary | ICD-10-CM | POA: Diagnosis not present

## 2020-09-13 DIAGNOSIS — R5383 Other fatigue: Secondary | ICD-10-CM | POA: Diagnosis not present

## 2020-09-13 DIAGNOSIS — R0602 Shortness of breath: Secondary | ICD-10-CM | POA: Diagnosis not present

## 2020-09-13 DIAGNOSIS — J029 Acute pharyngitis, unspecified: Secondary | ICD-10-CM | POA: Diagnosis not present

## 2020-09-13 DIAGNOSIS — Z20822 Contact with and (suspected) exposure to covid-19: Secondary | ICD-10-CM | POA: Diagnosis not present

## 2020-09-13 DIAGNOSIS — Z6824 Body mass index (BMI) 24.0-24.9, adult: Secondary | ICD-10-CM | POA: Insufficient documentation

## 2020-09-13 DIAGNOSIS — E785 Hyperlipidemia, unspecified: Secondary | ICD-10-CM | POA: Diagnosis not present

## 2020-09-13 DIAGNOSIS — Z79899 Other long term (current) drug therapy: Secondary | ICD-10-CM | POA: Insufficient documentation

## 2020-09-13 DIAGNOSIS — Z8616 Personal history of COVID-19: Secondary | ICD-10-CM | POA: Insufficient documentation

## 2020-09-13 DIAGNOSIS — J3489 Other specified disorders of nose and nasal sinuses: Secondary | ICD-10-CM | POA: Diagnosis not present

## 2020-09-13 DIAGNOSIS — E669 Obesity, unspecified: Secondary | ICD-10-CM | POA: Insufficient documentation

## 2020-09-13 DIAGNOSIS — R519 Headache, unspecified: Secondary | ICD-10-CM | POA: Diagnosis not present

## 2020-09-13 DIAGNOSIS — H409 Unspecified glaucoma: Secondary | ICD-10-CM | POA: Diagnosis not present

## 2020-09-13 DIAGNOSIS — J069 Acute upper respiratory infection, unspecified: Secondary | ICD-10-CM

## 2020-09-13 NOTE — ED Triage Notes (Signed)
Patient complains of sinus pain and pressure, cough x 1 days. States that he has been having some fatigue. Reports that he was positive for Covid in February.

## 2020-09-13 NOTE — ED Provider Notes (Signed)
[facility]    CSN: [id number] Arrival date & time: 09/13/20  1011      History   Chief Complaint Chief Complaint  Patient presents with  . Facial Pain    HPI Dylan Young is a 74 y.o. male.   74 yo male who presents for evaluation of sinus pain/pressure, body aches, fatigue, and cough x 1 day. He had COVID in February and has not gotten his vaccine yet but is thinking about it.      Past Medical History:  Diagnosis Date  . Arthritis   . Benign neoplasm of colon   . Benign prostatic hypertrophy with lower urinary tract symptoms (LUTS)   . Dysphagia   . ED (erectile dysfunction)   . Glaucoma   . Heart burn   . History of stomach ulcers   . Hyperlipidemia   . Hypogonadism male   . Kidney stones   . Obesity   . Osteoarthritis   . Tubular adenoma of colon   . Urinary frequency   . Urinary frequency   . Urinary urgency   . Vitamin D deficiency     Patient Active Problem List   Diagnosis Date Noted  . 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 Glaucoma 05/26/2015  . Arthritis, degenerative 05/26/2015  . Tubular adenoma of colon 05/26/2015  . Benign prostatic hyperplasia with urinary obstruction 03/08/2015    Past Surgical History:  Procedure Laterality Date  . APPENDECTOMY    . CATARACT EXTRACTION     Right (1996); Left (2007)  . COLONOSCOPY WITH PROPOFOL N/A 05/08/2016   Procedure: COLONOSCOPY WITH PROPOFOL;  Surgeon: Lollie Sails, MD;  Location: [facility] ENDOSCOPY;  Service: Endoscopy;  Laterality: N/A;  . EXTRACORPOREAL SHOCK WAVE LITHOTRIPSY Right 05/27/2015   Procedure: EXTRACORPOREAL SHOCK WAVE LITHOTRIPSY (ESWL);  Surgeon: Hollice Espy, MD;  Location: [facility] ORS;  Service: Urology;  Laterality: Right;  . EYE SURGERY    . LITHOTRIPSY    . RETINAL DETACHMENT SURGERY         Home Medications    Prior to Admission medications   Medication Sig Start Date End Date Taking? Authorizing Provider  bromfenac  (XIBROM) 0.09 % ophthalmic solution Apply 1 drop to eye 2 (two) times daily.    Yes [provider]  Cholecalciferol (VITAMIN D-3) 1000 UNITS CAPS Take 2 capsules by mouth daily.    Yes [provider]  Dorzolamide HCl-Timolol Mal PF 22.3-6.8 MG/ML SOLN Place 1 drop into both eyes 2 (two) times daily.   Yes [provider]  latanoprost (XALATAN) 0.005 % ophthalmic solution Apply 1 drop to eye at bedtime.  01/10/08  Yes [provider]  Multiple Vitamins-Minerals (MULTIVITAMIN ADULT PO) Take 1 tablet by mouth daily.   Yes [provider]  Vit B6-Vit B12-Omega 3 Acids (VITAMIN B PLUS+ PO) Take 1 tablet by mouth daily.   Yes [provider]  vitamin C (ASCORBIC ACID) 500 MG tablet Take 2,000 mg by mouth daily.    Yes [provider]  vitamin E 400 UNIT capsule Take 400 Units by mouth daily.   Yes [provider]  brompheniramine-pseudoephedrine-DM 30-2-10 MG/5ML syrup Take 10 mLs by mouth 4 (four) times daily as needed. Patient not taking: Reported on 07/16/2020 02/07/20   Cuthriell, Charline Bills, PA-C  ondansetron (ZOFRAN-ODT) 4 MG disintegrating tablet Take 1 tablet (4 mg total) by mouth every 8 (eight) hours as needed for nausea or vomiting. Patient not taking: Reported on 07/16/2020 02/07/20  Cuthriell, Charline Bills, PA-C    Family History Family History  Problem Relation Age of Onset  . Prostate cancer Father   . Alzheimer's disease Father   . Diabetes Mother   . Stroke Mother     Social History Social History   Tobacco Use  . Smoking status: Former Research scientist (life sciences)  . Smokeless tobacco: Former Systems developer    Quit date: 05/26/1975  Vaping Use  . Vaping Use: Never used  Substance Use Topics  . Alcohol use: Yes    Alcohol/week: 1.0 standard drink    Types: 1 Standard drinks or equivalent per week    Comment: occasional   . Drug use: No     Allergies   Patient has no known allergies.   Review of Systems Review of Systems    Constitutional: Positive for chills, fatigue and fever. Negative for activity change and appetite change.  HENT: Positive for congestion, sinus pressure, sinus pain and sore throat. Negative for ear pain, postnasal drip and rhinorrhea.   Respiratory: Positive for cough. Negative for shortness of breath and wheezing.   Cardiovascular: Negative for chest pain.  Gastrointestinal: Negative for diarrhea, nausea and vomiting.  Musculoskeletal: Positive for arthralgias and myalgias.  Skin: Negative.   Neurological: Negative for headaches.  Hematological: Negative.   Psychiatric/Behavioral: Negative.      Physical Exam Triage Vital Signs ED Triage Vitals  Enc Vitals Group     BP 09/13/20 1037 130/73     Pulse Rate 09/13/20 1037 73     Resp 09/13/20 1037 18     Temp 09/13/20 1037 98.9 F (37.2 C)     Temp Source 09/13/20 1037 Oral     SpO2 09/13/20 1037 100 %     Weight 09/13/20 1036 156 lb (70.8 kg)     Height 09/13/20 1036 5' 6.5" (1.689 m)     Head Circumference --      Peak Flow --      Pain Score 09/13/20 1035 7     Pain Loc --      Pain Edu? --      Excl. in [place]? --    No data found.  Updated Vital Signs BP 130/73 (BP Location: Left Arm)   Pulse 73   Temp 98.9 F (37.2 C) (Oral)   Resp 18   Ht 5' 6.5" (1.689 m)   Wt 156 lb (70.8 kg)   SpO2 100%   BMI 24.80 kg/m   Visual Acuity Right Eye Distance:   Left Eye Distance:   Bilateral Distance:    Right Eye Near:   Left Eye Near:    Bilateral Near:     Physical Exam Vitals and nursing note reviewed.  Constitutional:      Appearance: Normal appearance. He is normal weight.  HENT:     Head: Normocephalic and atraumatic.     Right Ear: Tympanic membrane, ear canal and external ear normal.     Left Ear: Tympanic membrane, ear canal and external ear normal.     Nose: No congestion or rhinorrhea.     Mouth/Throat:     Mouth: Mucous membranes are moist.     Pharynx: Oropharynx is clear. No oropharyngeal exudate or  posterior oropharyngeal erythema.  Eyes:     Conjunctiva/sclera: Conjunctivae normal.     Pupils: Pupils are equal, round, and reactive to light.  Cardiovascular:     Rate and Rhythm: Normal rate and regular rhythm.     Pulses: Normal pulses.  Radial pulses are 2+ on the right side and 2+ on the left side.       Dorsalis pedis pulses are 2+ on the right side and 2+ on the left side.       Posterior tibial pulses are 2+ on the right side and 2+ on the left side.     Heart sounds: Normal heart sounds.  Pulmonary:     Effort: Pulmonary effort is normal. No respiratory distress.     Breath sounds: Normal breath sounds. No wheezing or rales.  Musculoskeletal:        General: Normal range of motion.     Cervical back: Normal range of motion and neck supple.     Right lower leg: No edema.     Left lower leg: No edema.  Lymphadenopathy:     Cervical: No cervical adenopathy.  Skin:    General: Skin is warm and dry.     Capillary Refill: Capillary refill takes less than 2 seconds.  Neurological:     General: No focal deficit present.     Mental Status: He is alert and oriented to person, place, and time.  Psychiatric:        Mood and Affect: Mood normal.        Behavior: Behavior normal.        Thought Content: Thought content normal.        Judgment: Judgment normal.      UC Treatments / Results  Labs (all labs ordered are listed, but only abnormal results are displayed) Labs Reviewed  SARS CORONAVIRUS 2 (TAT 6-24 HRS)    EKG   Radiology DG Chest 2 View  Result Date: 09/13/2020 CLINICAL DATA:  Productive cough, shortness of breath. EXAM: CHEST - 2 VIEW COMPARISON:  February 07, 2020 FINDINGS: The heart size and mediastinal contours are within normal limits. Both lungs are clear. No pneumothorax or pleural effusion is noted. The visualized skeletal structures are unremarkable. IMPRESSION: No active cardiopulmonary disease. Aortic Atherosclerosis (ICD10-I70.0).  Electronically Signed   By: Marijo Conception M.D.   On: 09/13/2020 11:56    Procedures Procedures (including critical care time)  Medications Ordered in UC Medications - No data to display  Initial Impression / Assessment and Plan / UC Course  I have reviewed the triage vital signs and the nursing notes.  Pertinent labs & imaging results that were available during my care of the patient were reviewed by me and considered in my medical decision making (see chart for details).   Patient is here for evaluation of sinus pressure and pain without discharge, ST, body aches, chills, temp of 99.5 that responded to APAP, productive cough for a brown sputum. He denies ear pressure, changes to sense of smell or taste, or N/V/D.   Will check COVID swab and CXR.   CXR unremarkable  Will D/C home with supportive care and sinus irrigation instructions. Final Clinical Impressions(s) / UC Diagnoses   Final diagnoses:  Acute upper respiratory infection     Discharge Instructions     Your chest x-ray does not show signs of pneumonia.  Quarantine until the result of your COVID test is back.  Increase your oral fluid intake to keep your secretions thin.  Irrigate your sinuses with a Neilmed sinus rinse kit and distilled water twice daily. Do not use tap water. You can heat sterilize the bottle between uses by separating the two pieces, putting them on a pie plate and placing them in the microwave  for 90 seconds.   Use Tylenol as needed for pain and fever.  Return for worsening symptoms or follow-up with your PCP.     ED Prescriptions    None     PDMP not reviewed this encounter.   Margarette Canada, NP 09/13/20 1209

## 2020-09-13 NOTE — Discharge Instructions (Addendum)
Your chest x-ray does not show signs of pneumonia.  Quarantine until the result of your COVID test is back.  Increase your oral fluid intake to keep your secretions thin.  Irrigate your sinuses with a Neilmed sinus rinse kit and distilled water twice daily. Do not use tap water. You can heat sterilize the bottle between uses by separating the two pieces, putting them on a pie plate and placing them in the microwave for 90 seconds.   Use Tylenol as needed for pain and fever.  Return for worsening symptoms or follow-up with your PCP.

## 2020-09-14 ENCOUNTER — Telehealth: Payer: Self-pay | Admitting: *Deleted

## 2020-09-14 LAB — SARS CORONAVIRUS 2 (TAT 6-24 HRS): SARS Coronavirus 2: NEGATIVE

## 2020-09-14 NOTE — Telephone Encounter (Signed)
Pt is aware covid test is neg on 09-14-2020

## 2021-01-05 DIAGNOSIS — H401134 Primary open-angle glaucoma, bilateral, indeterminate stage: Secondary | ICD-10-CM | POA: Diagnosis not present

## 2021-01-13 NOTE — Progress Notes (Signed)
[facility]  [address], [address] [place], [place] [address] Phone: [phone]  Fax: [phone]   Clinic Day:  01/17/2021  Referring physician: Ezequiel Kayser, MD  Chief Complaint: Dylan Young is a 75 y.o. male with a monoclonal gammopathyof unknown significance (MGUS) who is seen for 6 month assessment.   HPI: The patient was last seen in the medical oncology clinic on 07/19/2020. At that time, he was feeling better.  Energy level was back to normal.  He was more active.  He had lost weight but was gaining it back. Hematocrit was 40.7, hemoglobin 13.8, platelets 249,000, WBC 7,400. Calcium was 8.8. M spike was 0.8 gm/dL. Kappa free light chains were 18.5, lambda free light chains 23.9, ratio 0.77.  During the interim, he feels "good for his age." He likes to walk but it has been more difficult due to the weather. He likes to walk 4 miles. He started taking a vitamin (he thinks B3) which has drastically improved his arthritis.   Past Medical History:  Diagnosis Date  . Arthritis   . Benign neoplasm of colon   . Benign prostatic hypertrophy with lower urinary tract symptoms (LUTS)   . Dysphagia   . ED (erectile dysfunction)   . Glaucoma   . Heart burn   . History of stomach ulcers   . Hyperlipidemia   . Hypogonadism male   . Kidney stones   . Obesity   . Osteoarthritis   . Tubular adenoma of colon   . Urinary frequency   . Urinary frequency   . Urinary urgency   . Vitamin D deficiency     Past Surgical History:  Procedure Laterality Date  . APPENDECTOMY    . CATARACT EXTRACTION     Right (1996); Left (2007)  . COLONOSCOPY WITH PROPOFOL N/A 05/08/2016   Procedure: COLONOSCOPY WITH PROPOFOL;  Surgeon: Lollie Sails, MD;  Location: [facility] ENDOSCOPY;  Service: Endoscopy;  Laterality: N/A;  . EXTRACORPOREAL SHOCK WAVE LITHOTRIPSY Right 05/27/2015   Procedure: EXTRACORPOREAL SHOCK WAVE LITHOTRIPSY (ESWL);  Surgeon: Hollice Espy, MD;   Location: [facility] ORS;  Service: Urology;  Laterality: Right;  . EYE SURGERY    . LITHOTRIPSY    . RETINAL DETACHMENT SURGERY      Family History  Problem Relation Age of Onset  . Prostate cancer Father   . Alzheimer's disease Father   . Diabetes Mother   . Stroke Mother     Social History:  reports that he has quit smoking. He quit smokeless tobacco use about 45 years ago. He reports current alcohol use of about 1.0 standard drink of alcohol per week. He reports that he does not use drugs. He stopped smoking 45 years ago. He rarely drinks alcohol. He denies any exposure to radiation or toxins. 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 The patient lives in [place]. He likes to garden. The patient is alone today.  Allergies: No Known Allergies  Current Medications: Current Outpatient Medications  Medication Sig Dispense Refill  . bromfenac (XIBROM) 0.09 % ophthalmic solution Apply 1 drop to eye 2 (two) times daily.     . Cholecalciferol (VITAMIN D-3) 1000 UNITS CAPS Take 2 capsules by mouth daily.     . Dorzolamide HCl-Timolol Mal PF 22.3-6.8 MG/ML SOLN Place 1 drop into both eyes 2 (two) times daily.    Marland Kitchen latanoprost (XALATAN) 0.005 % ophthalmic solution Apply 1 drop to eye at bedtime.     . Multiple  Vitamins-Minerals (MULTIVITAMIN ADULT PO) Take 1 tablet by mouth daily.    . ondansetron (ZOFRAN-ODT) 4 MG disintegrating tablet Take 1 tablet (4 mg total) by mouth every 8 (eight) hours as needed for nausea or vomiting. 20 tablet 0  . Vit B6-Vit B12-Omega 3 Acids (VITAMIN B PLUS+ PO) Take 1 tablet by mouth daily.    . vitamin C (ASCORBIC ACID) 500 MG tablet Take 2,000 mg by mouth daily.     . vitamin E 400 UNIT capsule Take 400 Units by mouth daily.    . Zinc 50 MG CAPS Take by mouth daily.    . brompheniramine-pseudoephedrine-DM 30-2-10 MG/5ML syrup Take 10 mLs by mouth 4 (four) times daily as needed. (Patient not taking: Reported on 01/17/2021) 200 mL 0   No  current facility-administered medications for this visit.    Review of Systems  Constitutional: Negative.  Negative for chills, diaphoresis, fever, malaise/fatigue and weight loss (stable).       Feels "good for his age." Staying active.  HENT: Negative.  Negative for congestion, ear discharge, ear pain, hearing loss, nosebleeds, sinus pain, sore throat and tinnitus.   Eyes: Negative for blurred vision, double vision and pain.       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  Negative for cough, hemoptysis, sputum production, shortness of breath and wheezing.   Cardiovascular: Negative.  Negative for chest pain, palpitations and leg swelling.  Gastrointestinal: Negative.  Negative for abdominal pain, blood in stool, constipation, diarrhea, heartburn, melena, nausea and vomiting.  Genitourinary: Negative.  Negative for dysuria, frequency, hematuria and urgency.  Musculoskeletal: Positive for back pain (lower back arthritis, improved) and joint pain (shoulder and knee arthritis, improved). Negative for myalgias and neck pain.  Skin: Negative.  Negative for itching and rash.  Neurological: Negative.  Negative for dizziness, tingling, sensory change, speech change, focal weakness, weakness and headaches.  Endo/Heme/Allergies: Negative.  Does not bruise/bleed easily.  Psychiatric/Behavioral: Negative.  Negative for depression and memory loss. The patient is not nervous/anxious and does not have insomnia.   All other systems reviewed and are negative.  Performance status (ECOG): 0  Vital Signs Blood pressure (!) 143/82, pulse 61, temperature (!) 97.2 F (36.2 C), temperature source Oral, resp. rate 20, weight 159 lb 1 oz (72.1 kg), SpO2 100 %.  Physical Exam Vitals and nursing note reviewed.  Constitutional:      General: He is not in acute distress.    Appearance: He is not diaphoretic.  HENT:     Head: Normocephalic and atraumatic.     Comments: Short gray  hair.    Mouth/Throat:     Mouth: Mucous membranes are moist.     Pharynx: Oropharynx is clear.  Eyes:     Extraocular Movements: Extraocular movements intact.     Pupils: Pupils are equal, round, and reactive to light.     Comments: Left eye slightly injected.  Cardiovascular:     Rate and Rhythm: Normal rate and regular rhythm.     Heart sounds: Normal heart sounds. No murmur heard.   Pulmonary:     Effort: Pulmonary effort is normal. No respiratory distress.     Breath sounds: Normal breath sounds. No wheezing or rales.  Chest:     Chest wall: No tenderness.  Breasts:     Right: No axillary adenopathy or supraclavicular adenopathy.     Left: No axillary adenopathy or supraclavicular adenopathy.    Abdominal:     General: Bowel sounds  are normal. There is no distension.     Palpations: Abdomen is soft. There is no mass.     Tenderness: There is no abdominal tenderness. There is no guarding or rebound.  Musculoskeletal:        General: No swelling or tenderness. Normal range of motion.     Cervical back: Normal range of motion and neck supple.  Lymphadenopathy:     Head:     Right side of head: No preauricular, posterior auricular or occipital adenopathy.     Left side of head: No preauricular, posterior auricular or occipital adenopathy.     Cervical: No cervical adenopathy.     Upper Body:     Right upper body: No supraclavicular or axillary adenopathy.     Left upper body: No supraclavicular or axillary adenopathy.     Lower Body: No right inguinal adenopathy. No left inguinal adenopathy.  Skin:    General: Skin is warm and dry.  Neurological:     Mental Status: He is alert and oriented to person, place, and time. Mental status is at baseline.  Psychiatric:        Mood and Affect: Mood and affect normal.        Behavior: Behavior normal.        Thought Content: Thought content normal.        Cognition and Memory: Memory normal.        Judgment: Judgment normal.      Appointment on 01/17/2021  Component Date Value Ref Range Status  . Sodium 01/17/2021 139  135 - 145 mmol/L Final  . Potassium 01/17/2021 4.2  3.5 - 5.1 mmol/L Final  . Chloride 01/17/2021 102  98 - 111 mmol/L Final  . CO2 01/17/2021 29  22 - 32 mmol/L Final  . Glucose, Bld 01/17/2021 115* 70 - 99 mg/dL Final   Glucose reference range applies only to samples taken after fasting for at least 8 hours.  . BUN 01/17/2021 20  8 - 23 mg/dL Final  . Creatinine, Ser 01/17/2021 0.87  0.61 - 1.24 mg/dL Final  . Calcium 01/17/2021 9.5  8.9 - 10.3 mg/dL Final  . Total Protein 01/17/2021 8.7* 6.5 - 8.1 g/dL Final  . Albumin 01/17/2021 4.4  3.5 - 5.0 g/dL Final  . AST 01/17/2021 20  15 - 41 U/L Final  . ALT 01/17/2021 21  0 - 44 U/L Final  . Alkaline Phosphatase 01/17/2021 67  38 - 126 U/L Final  . Total Bilirubin 01/17/2021 0.9  0.3 - 1.2 mg/dL Final  . GFR, Estimated 01/17/2021 >60  >60 mL/min Final   Comment: (NOTE) Calculated using the CKD-EPI Creatinine Equation (2021)   . Anion gap 01/17/2021 8  5 - 15 Final   Performed at [facility], [address] [place], [place] [address]  . WBC 01/17/2021 6.4  4.0 - 10.5 K/uL Final  . RBC 01/17/2021 4.88  4.22 - 5.81 MIL/uL Final  . Hemoglobin 01/17/2021 15.1  13.0 - 17.0 g/dL Final  . HCT 01/17/2021 44.9  39.0 - 52.0 % Final  . MCV 01/17/2021 92.0  80.0 - 100.0 fL Final  . MCH 01/17/2021 30.9  26.0 - 34.0 pg Final  . MCHC 01/17/2021 33.6  30.0 - 36.0 g/dL Final  . RDW 01/17/2021 12.6  11.5 - 15.5 % Final  . Platelets 01/17/2021 248  150 - 400 K/uL Final  . nRBC 01/17/2021 0.0  0.0 - 0.2 % Final  . Neutrophils Relative %  01/17/2021 53  % Final  . Neutro Abs 01/17/2021 3.4  1.7 - 7.7 K/uL Final  . Lymphocytes Relative 01/17/2021 39  % Final  . Lymphs Abs 01/17/2021 2.5  0.7 - 4.0 K/uL Final  . Monocytes Relative 01/17/2021 7  % Final  . Monocytes Absolute 01/17/2021 0.4  0.1 - 1.0 K/uL Final  . Eosinophils Relative 01/17/2021  1  % Final  . Eosinophils Absolute 01/17/2021 0.1  0.0 - 0.5 K/uL Final  . Basophils Relative 01/17/2021 0  % Final  . Basophils Absolute 01/17/2021 0.0  0.0 - 0.1 K/uL Final  . Immature Granulocytes 01/17/2021 0  % Final  . Abs Immature Granulocytes 01/17/2021 0.02  0.00 - 0.07 K/uL Final   Performed at [facility], [address] [place], [place] [address]    Assessment:  Dylan Young is a 75 y.o. male with a monoclonal gammopathy of unknown significance (MGUS).  M-protein is IgG with lambda light chain specificity.  Labs on 05/18/2020revealed a hemoglobin 14.9, hematocrit 44.3, platelets 260,000, and WBC 6200 ([place] 3510). Ferritin was134.Creatinine was 1.0. Calcium was 9.5. M-spikewas 0.9gm/dL.  Work-up on 06/09/2019 revealed a 0.8 gm/dL IgG with lambda monoclonal protein with light chain specificty.  Lambda light chain were 31.3 (ratio 0.54).  LDH and beta-2 microglobulin were normal. IgG was 1,976. 24hr UPEP was negative.   M-spike has been followed (gm/dL): 0.9 on 05/12/2019, 0.8 on 06/09/2019, 0.8 on 01/19/2020, 0.7 on 04/20/2020, 0.8 on 07/19/2020, and 1.0 on 01/17/2021.  The patient had COVID-19 in 01/2020. He has not received the COVID-19 vaccine.  Symptomatically, he feels "good for his age." He denies any fevers, sweats, weight loss, bone pain or recurrent infections. Exam reveals no adenopathy or hepatosplenomegaly.  Plan: 1.   Labs today: CBC with diff, CMP, SPEP, FLCA. 2.   Monoclonal gammopathy of unknown significance (MGUS)  Clinically, he is doing well.    He denies any symptoms. Exam is normal. M spike is stable at 0.8 gm/dL.  Continue to monitor every 6 months. 3.   RTC in 6 months for MD assessment and labs (CBC with diff, CMP, SPEP).  I discussed the assessment and treatment plan with the patient.  The patient was provided an opportunity to ask questions and all were answered.  The patient agreed with the plan and  demonstrated an understanding of the instructions.  The patient was advised to call back if the symptoms worsen or if the condition fails to improve as anticipated.   Lequita Asal, MD, PhD    01/17/2021, 10:58 AM  I, Mirian Mo Tufford, am acting as Education administrator for [employer]. Mike Gip, MD, PhD.  I, Eulla Kochanowski C. Mike Gip, MD, have reviewed the above documentation for accuracy and completeness, and I agree with the above.

## 2021-01-17 ENCOUNTER — Inpatient Hospital Stay: Payer: PPO

## 2021-01-17 ENCOUNTER — Other Ambulatory Visit: Payer: Self-pay

## 2021-01-17 ENCOUNTER — Encounter: Payer: Self-pay | Admitting: Hematology and Oncology

## 2021-01-17 ENCOUNTER — Inpatient Hospital Stay: Payer: PPO | Attending: Hematology and Oncology | Admitting: Hematology and Oncology

## 2021-01-17 DIAGNOSIS — Z79899 Other long term (current) drug therapy: Secondary | ICD-10-CM | POA: Insufficient documentation

## 2021-01-17 DIAGNOSIS — D472 Monoclonal gammopathy: Secondary | ICD-10-CM

## 2021-01-17 DIAGNOSIS — Z8042 Family history of malignant neoplasm of prostate: Secondary | ICD-10-CM | POA: Diagnosis not present

## 2021-01-17 DIAGNOSIS — E669 Obesity, unspecified: Secondary | ICD-10-CM | POA: Insufficient documentation

## 2021-01-17 DIAGNOSIS — Z833 Family history of diabetes mellitus: Secondary | ICD-10-CM | POA: Insufficient documentation

## 2021-01-17 DIAGNOSIS — H409 Unspecified glaucoma: Secondary | ICD-10-CM | POA: Diagnosis not present

## 2021-01-17 DIAGNOSIS — Z87891 Personal history of nicotine dependence: Secondary | ICD-10-CM | POA: Insufficient documentation

## 2021-01-17 DIAGNOSIS — E785 Hyperlipidemia, unspecified: Secondary | ICD-10-CM | POA: Insufficient documentation

## 2021-01-17 LAB — CBC WITH DIFFERENTIAL/PLATELET
Abs Immature Granulocytes: 0.02 10*3/uL (ref 0.00–0.07)
Basophils Absolute: 0 10*3/uL (ref 0.0–0.1)
Basophils Relative: 0 %
Eosinophils Absolute: 0.1 10*3/uL (ref 0.0–0.5)
Eosinophils Relative: 1 %
HCT: 44.9 % (ref 39.0–52.0)
Hemoglobin: 15.1 g/dL (ref 13.0–17.0)
Immature Granulocytes: 0 %
Lymphocytes Relative: 39 %
Lymphs Abs: 2.5 10*3/uL (ref 0.7–4.0)
MCH: 30.9 pg (ref 26.0–34.0)
MCHC: 33.6 g/dL (ref 30.0–36.0)
MCV: 92 fL (ref 80.0–100.0)
Monocytes Absolute: 0.4 10*3/uL (ref 0.1–1.0)
Monocytes Relative: 7 %
Neutro Abs: 3.4 10*3/uL (ref 1.7–7.7)
Neutrophils Relative %: 53 %
Platelets: 248 10*3/uL (ref 150–400)
RBC: 4.88 MIL/uL (ref 4.22–5.81)
RDW: 12.6 % (ref 11.5–15.5)
WBC: 6.4 10*3/uL (ref 4.0–10.5)
nRBC: 0 % (ref 0.0–0.2)

## 2021-01-17 LAB — COMPREHENSIVE METABOLIC PANEL
ALT: 21 U/L (ref 0–44)
AST: 20 U/L (ref 15–41)
Albumin: 4.4 g/dL (ref 3.5–5.0)
Alkaline Phosphatase: 67 U/L (ref 38–126)
Anion gap: 8 (ref 5–15)
BUN: 20 mg/dL (ref 8–23)
CO2: 29 mmol/L (ref 22–32)
Calcium: 9.5 mg/dL (ref 8.9–10.3)
Chloride: 102 mmol/L (ref 98–111)
Creatinine, Ser: 0.87 mg/dL (ref 0.61–1.24)
GFR, Estimated: >60 mL/min (ref >60)
Glucose, Bld: 115 mg/dL — ABNORMAL HIGH (ref 70–99)
Potassium: 4.2 mmol/L (ref 3.5–5.1)
Sodium: 139 mmol/L (ref 135–145)
Total Bilirubin: 0.9 mg/dL (ref 0.3–1.2)
Total Protein: 8.7 g/dL — ABNORMAL HIGH (ref 6.5–8.1)

## 2021-01-18 LAB — PROTEIN ELECTROPHORESIS, SERUM
A/G Ratio: 1 (ref 0.7–1.7)
Albumin ELP: 3.9 g/dL (ref 2.9–4.4)
Alpha-1-Globulin: 0.2 g/dL (ref 0.0–0.4)
Alpha-2-Globulin: 0.6 g/dL (ref 0.4–1.0)
Beta Globulin: 1.1 g/dL (ref 0.7–1.3)
Gamma Globulin: 2.1 g/dL — ABNORMAL HIGH (ref 0.4–1.8)
Globulin, Total: 4 g/dL — ABNORMAL HIGH (ref 2.2–3.9)
M-Spike, %: 1 g/dL — ABNORMAL HIGH
Total Protein ELP: 7.9 g/dL (ref 6.0–8.5)

## 2021-01-18 LAB — KAPPA/LAMBDA LIGHT CHAINS
Kappa free light chain: 16.9 mg/L (ref 3.3–19.4)
Kappa, lambda light chain ratio: 0.59 (ref 0.26–1.65)
Lambda free light chains: 28.5 mg/L — ABNORMAL HIGH (ref 5.7–26.3)

## 2021-02-02 DIAGNOSIS — H401133 Primary open-angle glaucoma, bilateral, severe stage: Secondary | ICD-10-CM | POA: Diagnosis not present

## 2021-03-29 DIAGNOSIS — H401133 Primary open-angle glaucoma, bilateral, severe stage: Secondary | ICD-10-CM | POA: Diagnosis not present

## 2021-04-11 DIAGNOSIS — J0100 Acute maxillary sinusitis, unspecified: Secondary | ICD-10-CM | POA: Diagnosis not present

## 2021-04-11 DIAGNOSIS — J301 Allergic rhinitis due to pollen: Secondary | ICD-10-CM | POA: Diagnosis not present

## 2021-06-08 DIAGNOSIS — H401133 Primary open-angle glaucoma, bilateral, severe stage: Secondary | ICD-10-CM | POA: Diagnosis not present

## 2021-07-18 ENCOUNTER — Ambulatory Visit: Payer: PPO | Admitting: Hematology and Oncology

## 2021-07-18 ENCOUNTER — Other Ambulatory Visit: Payer: PPO

## 2021-07-19 ENCOUNTER — Other Ambulatory Visit: Payer: Self-pay

## 2021-07-19 ENCOUNTER — Inpatient Hospital Stay: Payer: PPO | Attending: Internal Medicine

## 2021-07-19 ENCOUNTER — Encounter: Payer: Self-pay | Admitting: Internal Medicine

## 2021-07-19 ENCOUNTER — Inpatient Hospital Stay (HOSPITAL_BASED_OUTPATIENT_CLINIC_OR_DEPARTMENT_OTHER): Payer: PPO | Admitting: Internal Medicine

## 2021-07-19 VITALS — BP 149/81 | HR 60 | Temp 97.2°F | Resp 16 | Wt 158.1 lb

## 2021-07-19 DIAGNOSIS — D472 Monoclonal gammopathy: Secondary | ICD-10-CM | POA: Insufficient documentation

## 2021-07-19 LAB — COMPREHENSIVE METABOLIC PANEL
ALT: 21 U/L (ref 0–44)
AST: 24 U/L (ref 15–41)
Albumin: 4.2 g/dL (ref 3.5–5.0)
Alkaline Phosphatase: 59 U/L (ref 38–126)
Anion gap: 8 (ref 5–15)
BUN: 15 mg/dL (ref 8–23)
CO2: 27 mmol/L (ref 22–32)
Calcium: 9.2 mg/dL (ref 8.9–10.3)
Chloride: 101 mmol/L (ref 98–111)
Creatinine, Ser: 0.82 mg/dL (ref 0.61–1.24)
GFR, Estimated: >60 mL/min (ref >60)
Glucose, Bld: 116 mg/dL — ABNORMAL HIGH (ref 70–99)
Potassium: 4.2 mmol/L (ref 3.5–5.1)
Sodium: 136 mmol/L (ref 135–145)
Total Bilirubin: 0.9 mg/dL (ref 0.3–1.2)
Total Protein: 8.1 g/dL (ref 6.5–8.1)

## 2021-07-19 LAB — CBC WITH DIFFERENTIAL/PLATELET
Abs Immature Granulocytes: 0.02 10*3/uL (ref 0.00–0.07)
Basophils Absolute: 0 10*3/uL (ref 0.0–0.1)
Basophils Relative: 0 %
Eosinophils Absolute: 0.1 10*3/uL (ref 0.0–0.5)
Eosinophils Relative: 2 %
HCT: 40.4 % (ref 39.0–52.0)
Hemoglobin: 13.9 g/dL (ref 13.0–17.0)
Immature Granulocytes: 0 %
Lymphocytes Relative: 33 %
Lymphs Abs: 2.4 10*3/uL (ref 0.7–4.0)
MCH: 31.7 pg (ref 26.0–34.0)
MCHC: 34.4 g/dL (ref 30.0–36.0)
MCV: 92.2 fL (ref 80.0–100.0)
Monocytes Absolute: 0.6 10*3/uL (ref 0.1–1.0)
Monocytes Relative: 8 %
Neutro Abs: 4.3 10*3/uL (ref 1.7–7.7)
Neutrophils Relative %: 57 %
Platelets: 255 10*3/uL (ref 150–400)
RBC: 4.38 MIL/uL (ref 4.22–5.81)
RDW: 12.7 % (ref 11.5–15.5)
WBC: 7.4 10*3/uL (ref 4.0–10.5)
nRBC: 0 % (ref 0.0–0.2)

## 2021-07-19 NOTE — Assessment & Plan Note (Addendum)
#    monoclonal gammopathyof unknown significance (MGUS).M-protein is IgG with lambda light chain specificity.  August 2022 0.7 stable.  No evidence of any organ dysfunction.  # HTN: consistently 130-140/80-90s- even at home.  # DISPOSITION: # follow up- in 6 months- MD; 1 week - labs- cbc/cmp/MM panel; K/L light chains- Dr.B

## 2021-07-19 NOTE — Progress Notes (Signed)
[facility] NOTE  Patient Care Team: Ezequiel Kayser, MD (Inactive) as PCP - General (Internal Medicine)  CHIEF COMPLAINTS/PURPOSE OF CONSULTATION: MGUS    #  Oncology History   No history exists.     HISTORY OF PRESENTING ILLNESS:  Dylan Young 75 y.o.  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  Patient denies any blood in stools or black or stools.  Any nausea or vomiting.  No bone pain.  No joint pains.   Review of Systems  Constitutional:  Negative for chills, diaphoresis, fever, malaise/fatigue and weight loss.  HENT:  Negative for nosebleeds and sore throat.   Eyes:  Negative for double vision.  Respiratory:  Negative for cough, hemoptysis, sputum production, shortness of breath and wheezing.   Cardiovascular:  Negative for chest pain, palpitations, orthopnea and leg swelling.  Gastrointestinal:  Negative for abdominal pain, blood in stool, constipation, diarrhea, heartburn, melena, nausea and vomiting.  Genitourinary:  Negative for dysuria, frequency and urgency.  Musculoskeletal:  Negative for back pain and joint pain.  Skin: Negative.  Negative for itching and rash.  Neurological:  Negative for dizziness, tingling, focal weakness, weakness and headaches.  Endo/Heme/Allergies:  Does not bruise/bleed easily.  Psychiatric/Behavioral:  Negative for depression. The patient is not nervous/anxious and does not have insomnia.     MEDICAL HISTORY:  Past Medical History:  Diagnosis Date  . Arthritis   . Benign neoplasm of colon   . Benign prostatic hypertrophy with lower urinary tract symptoms (LUTS)   . Dysphagia   . ED (erectile dysfunction)   . Glaucoma   . Heart burn   . History of stomach ulcers   . Hyperlipidemia   . Hypogonadism male   . Kidney stones   . Obesity   . Osteoarthritis   . Tubular adenoma of colon   . Urinary frequency   . Urinary frequency   . Urinary urgency   . Vitamin D deficiency     SURGICAL HISTORY: Past Surgical  History:  Procedure Laterality Date  . APPENDECTOMY    . CATARACT EXTRACTION     Right (1996); Left (2007)  . COLONOSCOPY WITH PROPOFOL N/A 05/08/2016   Procedure: COLONOSCOPY WITH PROPOFOL;  Surgeon: Lollie Sails, MD;  Location: [facility] ENDOSCOPY;  Service: Endoscopy;  Laterality: N/A;  . EXTRACORPOREAL SHOCK WAVE LITHOTRIPSY Right 05/27/2015   Procedure: EXTRACORPOREAL SHOCK WAVE LITHOTRIPSY (ESWL);  Surgeon: Hollice Espy, MD;  Location: [facility] ORS;  Service: Urology;  Laterality: Right;  . EYE SURGERY    . LITHOTRIPSY    . RETINAL DETACHMENT SURGERY      SOCIAL HISTORY: Social History   Socioeconomic History  . Marital status: Married    Spouse name: Not on file  . Number of children: Not on file  . Years of education: Not on file  . Highest education level: Not on file  Occupational History  . Not on file  Tobacco Use  . Smoking status: Former  . Smokeless tobacco: Former    Quit date: 05/26/1975  Vaping Use  . Vaping Use: Never used  Substance and Sexual Activity  . Alcohol use: Yes    Alcohol/week: 1.0 standard drink    Types: 1 Standard drinks or equivalent per week    Comment: occasional   . Drug use: No  . Sexual activity: Not on file  Other Topics Concern  . Not on file  Social History Narrative  . Not on file   Social Determinants of Health  Financial Resource Strain: Not on file  Food Insecurity: Not on file  Transportation Needs: Not on file  Physical Activity: Not on file  Stress: Not on file  Social Connections: Not on file  Intimate Partner Violence: Not on file    FAMILY HISTORY: Family History  Problem Relation Age of Onset  . Prostate cancer Father   . Alzheimer's disease Father   . Diabetes Mother   . Stroke Mother     ALLERGIES:  has No Known Allergies.  MEDICATIONS:  Current Outpatient Medications  Medication Sig Dispense Refill  . B Complex Vitamins (VITAMIN B COMPLEX) TABS Take 1 tablet by mouth daily.    . brimonidine  (ALPHAGAN) 0.2 % ophthalmic solution 1 drop 2 (two) times daily.    . bromfenac (XIBROM) 0.09 % ophthalmic solution Apply 1 drop to eye 2 (two) times daily.     . brompheniramine-pseudoephedrine-DM 30-2-10 MG/5ML syrup Take 10 mLs by mouth 4 (four) times daily as needed. 200 mL 0  . Cholecalciferol (VITAMIN D-3) 1000 UNITS CAPS Take 2 capsules by mouth daily.     . Dorzolamide HCl-Timolol Mal PF 22.3-6.8 MG/ML SOLN Place 1 drop into both eyes 2 (two) times daily.    Marland Kitchen latanoprost (XALATAN) 0.005 % ophthalmic solution Apply 1 drop to eye at bedtime.     . Multiple Vitamins-Minerals (MULTIVITAMIN ADULT PO) Take 1 tablet by mouth daily.    Marland Kitchen ROCKLATAN 0.02-0.005 % SOLN Apply 1 drop to eye at bedtime.    . Vit B6-Vit B12-Omega 3 Acids (VITAMIN B PLUS+ PO) Take 1 tablet by mouth daily.    . vitamin C (ASCORBIC ACID) 500 MG tablet Take 2,000 mg by mouth daily.     . vitamin E 400 UNIT capsule Take 400 Units by mouth daily.    . Zinc 50 MG CAPS Take by mouth daily.    . ondansetron (ZOFRAN-ODT) 4 MG disintegrating tablet Take 1 tablet (4 mg total) by mouth every 8 (eight) hours as needed for nausea or vomiting. (Patient not taking: Reported on 07/19/2021) 20 tablet 0   No current facility-administered medications for this visit.      Marland Kitchen  PHYSICAL EXAMINATION: ECOG PERFORMANCE STATUS: 0 - Asymptomatic  Vitals:   07/19/21 1010  BP: (!) 149/81  Pulse: 60  Resp: 16  Temp: (!) 97.2 F (36.2 C)  SpO2: 100%   Filed Weights   07/19/21 1010  Weight: 158 lb 1.1 oz (71.7 kg)    Physical Exam Vitals and nursing note reviewed.  Constitutional:      Comments: Ambulating independently.  Accompanied by his wife.      HENT:     Head: Normocephalic and atraumatic.     Mouth/Throat:     Pharynx: Oropharynx is clear.  Eyes:     Extraocular Movements: Extraocular movements intact.     Pupils: Pupils are equal, round, and reactive to light.  Cardiovascular:     Rate and Rhythm: Normal rate and  regular rhythm.  Pulmonary:     Comments: Decreased breath sounds bilaterally.  Abdominal:     Palpations: Abdomen is soft.  Musculoskeletal:        General: Normal range of motion.     Cervical back: Normal range of motion.  Skin:    General: Skin is warm.  Neurological:     General: No focal deficit present.     Mental Status: He is alert and oriented to person, place, and time.  Psychiatric:  Behavior: Behavior normal.        Judgment: Judgment normal.     LABORATORY DATA:  I have reviewed the data as listed Lab Results  Component Value Date   WBC 7.4 07/19/2021   HGB 13.9 07/19/2021   HCT 40.4 07/19/2021   MCV 92.2 07/19/2021   PLT 255 07/19/2021   Recent Labs    01/17/21 0953 07/19/21 0957  NA 139 136  K 4.2 4.2  CL 102 101  CO2 29 27  GLUCOSE 115* 116*  BUN 20 15  CREATININE 0.87 0.82  CALCIUM 9.5 9.2  GFRNONAA >60 >60  PROT 8.7* 8.1  ALBUMIN 4.4 4.2  AST 20 24  ALT 21 21  ALKPHOS 67 59  BILITOT 0.9 0.9    RADIOGRAPHIC STUDIES: I have personally reviewed the radiological images as listed and agreed with the findings in the report. No results found.  ASSESSMENT & PLAN:   Monoclonal gammopathy of unknown significance (MGUS) #  monoclonal gammopathy of unknown significance (MGUS).  M-protein is IgG with lambda light chain specificity.  August 2022 0.7 stable.  No evidence of any organ dysfunction.  # HTN: consistently 130-140/80-90s- even at home.  # DISPOSITION: # follow up- in 6 months- MD; 1 week - labs- cbc/cmp/MM panel; K/L light chains- Dr.B        All questions were answered. The patient knows to call the clinic with any problems, questions or concerns.       Cammie Sickle, MD 08/14/2021 11:05 PM

## 2021-07-19 NOTE — Progress Notes (Signed)
Pts wife will like to know if pt is able to start BP medication?

## 2021-07-20 LAB — KAPPA/LAMBDA LIGHT CHAINS
Kappa free light chain: 17.5 mg/L (ref 3.3–19.4)
Kappa, lambda light chain ratio: 0.61 (ref 0.26–1.65)
Lambda free light chains: 28.5 mg/L — ABNORMAL HIGH (ref 5.7–26.3)

## 2021-07-21 LAB — PROTEIN ELECTROPHORESIS, SERUM
A/G Ratio: 1.1 (ref 0.7–1.7)
Albumin ELP: 3.9 g/dL (ref 2.9–4.4)
Alpha-1-Globulin: 0.2 g/dL (ref 0.0–0.4)
Alpha-2-Globulin: 0.6 g/dL (ref 0.4–1.0)
Beta Globulin: 1 g/dL (ref 0.7–1.3)
Gamma Globulin: 1.8 g/dL (ref 0.4–1.8)
Globulin, Total: 3.6 g/dL (ref 2.2–3.9)
M-Spike, %: 0.7 g/dL — ABNORMAL HIGH
Total Protein ELP: 7.5 g/dL (ref 6.0–8.5)

## 2021-08-23 ENCOUNTER — Other Ambulatory Visit: Payer: PPO

## 2021-08-23 ENCOUNTER — Ambulatory Visit: Payer: PPO | Admitting: Internal Medicine

## 2021-09-18 ENCOUNTER — Ambulatory Visit
Admission: RE | Admit: 2021-09-18 | Discharge: 2021-09-18 | Disposition: A | Discharge status: Home / Self Care | Payer: PPO | Source: Ambulatory Visit | Attending: Emergency Medicine | Admitting: Emergency Medicine

## 2021-09-18 ENCOUNTER — Other Ambulatory Visit: Payer: Self-pay

## 2021-09-18 VITALS — BP 164/88 | HR 61 | Temp 98.2°F | Resp 16 | Ht 66.0 in | Wt 156.0 lb

## 2021-09-18 DIAGNOSIS — R051 Acute cough: Secondary | ICD-10-CM | POA: Insufficient documentation

## 2021-09-18 DIAGNOSIS — Z79899 Other long term (current) drug therapy: Secondary | ICD-10-CM | POA: Insufficient documentation

## 2021-09-18 DIAGNOSIS — J069 Acute upper respiratory infection, unspecified: Secondary | ICD-10-CM | POA: Diagnosis not present

## 2021-09-18 DIAGNOSIS — Z20822 Contact with and (suspected) exposure to covid-19: Secondary | ICD-10-CM | POA: Insufficient documentation

## 2021-09-18 DIAGNOSIS — Z28310 Unvaccinated for covid-19: Secondary | ICD-10-CM | POA: Insufficient documentation

## 2021-09-18 MED ORDER — IPRATROPIUM BROMIDE 0.06 % NA SOLN: 2.0000 | Freq: Four times a day (QID) | NASAL | 12 refills | Status: DC

## 2021-09-18 MED ORDER — BENZONATATE 100 MG PO CAPS: 200.0000 mg | ORAL_CAPSULE | Freq: Three times a day (TID) | ORAL | 0 refills | Status: DC

## 2021-09-18 NOTE — ED Provider Notes (Signed)
[facility]    CSN: [id number] Arrival date & time: 09/18/21  0849      History   Chief Complaint Chief Complaint  Patient presents with   Appointment   Cough    HPI Dylan Young is a 75 y.o. male.   HPI  75 year old male here for evaluation of respiratory complaints.  Patient reports that for the last 2 days he has been experiencing sinus pressure between his eyes and behind his eyes.  Also some in his cheeks.  He has been congested and has had limited runny nose and nasal discharge for clear to white mucus.  He also endorses some sneezing.  He has been having some itchy eyes without discharge, been feeling fatigued, and also complains of an intermittently productive cough for a clear to white sputum.  He denies fever, ear pain, sore throat, shortness of breath or wheezing, GI complaints, body aches, or known sick contacts.  Patient has had COVID in the past but he is not vaccinated.  Past Medical History:  Diagnosis Date   Arthritis    Benign neoplasm of colon    Benign prostatic hypertrophy with lower urinary tract symptoms (LUTS)    Dysphagia    ED (erectile dysfunction)    Glaucoma    Heart burn    History of stomach ulcers    Hyperlipidemia    Hypogonadism male    Kidney stones    Obesity    Osteoarthritis    Tubular adenoma of colon    Urinary frequency    Urinary frequency    Urinary urgency    Vitamin D deficiency     Patient Active Problem List   Diagnosis Date Noted   Monoclonal gammopathy of unknown significance (MGUS) 06/08/2019   Glaucoma 05/26/2015   Arthritis, degenerative 05/26/2015   Tubular adenoma of colon 05/26/2015   Benign prostatic hyperplasia with urinary obstruction 03/08/2015    Past Surgical History:  Procedure Laterality Date   APPENDECTOMY     CATARACT EXTRACTION     Right (1996); Left (2007)   COLONOSCOPY WITH PROPOFOL N/A 05/08/2016   Procedure: COLONOSCOPY WITH PROPOFOL;  Surgeon: Lollie Sails, MD;   Location: [facility] ENDOSCOPY;  Service: Endoscopy;  Laterality: N/A;   EXTRACORPOREAL SHOCK WAVE LITHOTRIPSY Right 05/27/2015   Procedure: EXTRACORPOREAL SHOCK WAVE LITHOTRIPSY (ESWL);  Surgeon: Hollice Espy, MD;  Location: [facility] ORS;  Service: Urology;  Laterality: Right;   EYE SURGERY     LITHOTRIPSY     RETINAL DETACHMENT SURGERY         Home Medications    Prior to Admission medications   Medication Sig Start Date End Date Taking? Authorizing Provider  B Complex Vitamins (VITAMIN B COMPLEX) TABS Take 1 tablet by mouth daily.   Yes [provider]  benzonatate (TESSALON) 100 MG capsule Take 2 capsules (200 mg total) by mouth every 8 (eight) hours. 09/18/21  Yes Margarette Canada, NP  brimonidine (ALPHAGAN) 0.2 % ophthalmic solution 1 drop 2 (two) times daily. 05/01/21  Yes [provider]  bromfenac (XIBROM) 0.09 % ophthalmic solution Apply 1 drop to eye 2 (two) times daily.    Yes [provider]  ipratropium (ATROVENT) 0.06 % nasal spray Place 2 sprays into both nostrils 4 (four) times daily. 09/18/21  Yes Margarette Canada, NP  Multiple Vitamins-Minerals (MULTIVITAMIN ADULT PO) Take 1 tablet by mouth daily.   Yes [provider]  Vit B6-Vit B12-Omega 3 Acids (VITAMIN B PLUS+ PO) Take 1 tablet by  mouth daily.   Yes [provider]  vitamin C (ASCORBIC ACID) 500 MG tablet Take 2,000 mg by mouth daily.    Yes [provider]  vitamin E 400 UNIT capsule Take 400 Units by mouth daily.   Yes [provider]  Zinc 50 MG CAPS Take by mouth daily.   Yes [provider]  brompheniramine-pseudoephedrine-DM 30-2-10 MG/5ML syrup Take 10 mLs by mouth 4 (four) times daily as needed. 02/07/20   Cuthriell, Charline Bills, PA-C  Cholecalciferol (VITAMIN D-3) 1000 UNITS CAPS Take 2 capsules by mouth daily.     [provider]  Dorzolamide HCl-Timolol Mal PF 22.3-6.8 MG/ML SOLN Place 1 drop into both eyes 2 (two) times daily.    [provider]  latanoprost (XALATAN) 0.005 % ophthalmic solution Apply 1 drop to eye at bedtime.  01/10/08   [provider]  ondansetron (ZOFRAN-ODT) 4 MG disintegrating tablet Take 1 tablet (4 mg total) by mouth every 8 (eight) hours as needed for nausea or vomiting. Patient not taking: Reported on 07/19/2021 02/07/20   Cuthriell, Charline Bills, PA-C  ROCKLATAN 0.02-0.005 % SOLN Apply 1 drop to eye at bedtime. 07/07/21   [provider]    Family History Family History  Problem Relation Age of Onset   Prostate cancer Father    Alzheimer's disease Father    Diabetes Mother    Stroke Mother     Social History Social History   Tobacco Use   Smoking status: Former   Smokeless tobacco: Former    Quit date: 05/26/1975  Vaping Use   Vaping Use: Never used  Substance Use Topics   Alcohol use: Yes    Alcohol/week: 1.0 standard drink    Types: 1 Standard drinks or equivalent per week    Comment: occasional    Drug use: No     Allergies   Patient has no known allergies.   Review of Systems Review of Systems  Constitutional:  Positive for fatigue. Negative for activity change, appetite change and fever.  HENT:  Positive for congestion, rhinorrhea and sinus pressure. Negative for ear discharge, ear pain and sore throat.   Respiratory:  Positive for cough. Negative for shortness of breath and wheezing.   Gastrointestinal:  Negative for diarrhea, nausea and vomiting.  Musculoskeletal:  Negative for arthralgias and myalgias.  Skin:  Negative for rash.  Hematological: Negative.   Psychiatric/Behavioral: Negative.      Physical Exam Triage Vital Signs ED Triage Vitals  Enc Vitals Group     BP 09/18/21 0942 (!) 164/88     Pulse Rate 09/18/21 0942 61     Resp 09/18/21 0942 16     Temp 09/18/21 0942 98.2 F (36.8 C)     Temp Source 09/18/21 0942 Oral     SpO2 09/18/21 0942 99 %     Weight 09/18/21 0940 156 lb (70.8 kg)     Height 09/18/21 0940 5\' 6"  (1.676 m)      Head Circumference --      Peak Flow --      Pain Score 09/18/21 0939 7     Pain Loc --      Pain Edu? --      Excl. in [place]? --    No data found.  Updated Vital Signs BP (!) 164/88 (BP Location: Right Arm)   Pulse 61   Temp 98.2 F (36.8 C) (Oral)   Resp 16   Ht 5\' 6"  (1.676 m)  Wt 156 lb (70.8 kg)   SpO2 99%   BMI 25.18 kg/m   Visual Acuity Right Eye Distance:   Left Eye Distance:   Bilateral Distance:    Right Eye Near:   Left Eye Near:    Bilateral Near:     Physical Exam Vitals and nursing note reviewed.  Constitutional:      General: He is not in acute distress.    Appearance: Normal appearance. He is normal weight. He is not ill-appearing.  HENT:     Head: Normocephalic and atraumatic.     Right Ear: Tympanic membrane, ear canal and external ear normal. There is no impacted cerumen.     Left Ear: Tympanic membrane, ear canal and external ear normal. There is no impacted cerumen.     Nose: Congestion and rhinorrhea present.     Mouth/Throat:     Mouth: Mucous membranes are moist.     Pharynx: Oropharynx is clear. No oropharyngeal exudate or posterior oropharyngeal erythema.  Eyes:     General: No scleral icterus.       Right eye: No discharge.        Left eye: No discharge.     Extraocular Movements: Extraocular movements intact.     Conjunctiva/sclera: Conjunctivae normal.     Pupils: Pupils are equal, round, and reactive to light.  Cardiovascular:     Rate and Rhythm: Normal rate and regular rhythm.     Pulses: Normal pulses.     Heart sounds: Normal heart sounds. No murmur heard.   No gallop.  Pulmonary:     Effort: Pulmonary effort is normal.     Breath sounds: Normal breath sounds. No wheezing or rhonchi.  Musculoskeletal:     Cervical back: Normal range of motion and neck supple.  Lymphadenopathy:     Cervical: No cervical adenopathy.  Skin:    General: Skin is warm and dry.     Capillary Refill: Capillary refill takes less than 2 seconds.      Findings: No erythema or rash.  Neurological:     General: No focal deficit present.     Mental Status: He is alert and oriented to person, place, and time.  Psychiatric:        Mood and Affect: Mood normal.        Behavior: Behavior normal.        Thought Content: Thought content normal.        Judgment: Judgment normal.     UC Treatments / Results  Labs (all labs ordered are listed, but only abnormal results are displayed) Labs Reviewed  SARS CORONAVIRUS 2 (TAT 6-24 HRS)    EKG   Radiology No results found.  Procedures Procedures (including critical care time)  Medications Ordered in UC Medications - No data to display  Initial Impression / Assessment and Plan / UC Course  I have reviewed the triage vital signs and the nursing notes.  Pertinent labs & imaging results that were available during my care of the patient were reviewed by me and considered in my medical decision making (see chart for details).  Patient very pleasant, nontoxic-appearing 75 year old male here for evaluation of respiratory complaints as outlined in HPI above.  Patient's physical exam reveals pearly gray tympanic membranes bilaterally with normal light reflex and clear external auditory canals.  Nasal mucosa is mildly erythematous and edematous with scant clear nasal discharge in both nares.  No purulent discharge noted.  Oropharyngeal exam is benign.  No cervical  lymphadenopathy appreciated exam.  Cardiopulmonary exam reveals clear lung sounds in all fields.  Patient is not dyspneic or tachypneic and has normal chest excursion with respirations.  Patient's exam is consistent with a viral URI.  He states that he is having a productive cough throughout the day but he has not coughed once during the exam and he can speak in full sentences without difficulty.  Will diagnose patient with a viral URI and discharged home to isolate pending the results of his COVID swab which was collected in triage.  We will  give Atrovent nasal spray and Tessalon Perles to help with cough and nasal congestion.  He has a history of glaucoma and I am reluctant to give him antihistamines which may exacerbate that condition.  Return and ER precautions reviewed with patient.   Final Clinical Impressions(s) / UC Diagnoses   Final diagnoses:  Viral URI with cough     Discharge Instructions      Isolate at home pending the results of your COVID test.  If you test positive then you will have to quarantine for 5 days from the start of your symptoms.  After 5 days you can break quarantine if your symptoms have improved and you have not had a fever for 24 hours without taking Tylenol or ibuprofen.  Use over-the-counter Tylenol and ibuprofen as needed for body aches and fever.  Use the [employer]  as needed for cough.  If you develop any increased shortness of breath-especially at rest, you are unable to speak in full sentences, or is a late sign your lips are turning blue you need to go the ER for evaluation.      ED Prescriptions     Medication Sig Dispense Auth. Provider   benzonatate (TESSALON) 100 MG capsule Take 2 capsules (200 mg total) by mouth every 8 (eight) hours. 21 capsule Margarette Canada, NP   ipratropium (ATROVENT) 0.06 % nasal spray Place 2 sprays into both nostrils 4 (four) times daily. 15 mL Margarette Canada, NP      PDMP not reviewed this encounter.   Margarette Canada, NP 09/18/21 1030

## 2021-09-18 NOTE — ED Triage Notes (Signed)
Patient c/o sinus pressure and congestion, sneezing, cough that started on Friday.  Patient denies fevers.

## 2021-09-18 NOTE — Discharge Instructions (Addendum)
Isolate at home pending the results of your COVID test.  If you test positive then you will have to quarantine for 5 days from the start of your symptoms.  After 5 days you can break quarantine if your symptoms have improved and you have not had a fever for 24 hours without taking Tylenol or ibuprofen.  Use over-the-counter Tylenol and ibuprofen as needed for body aches and fever.  Use the [employer]  as needed for cough.  If you develop any increased shortness of breath-especially at rest, you are unable to speak in full sentences, or is a late sign your lips are turning blue you need to go the ER for evaluation.

## 2021-09-19 LAB — SARS CORONAVIRUS 2 (TAT 6-24 HRS): SARS Coronavirus 2: NEGATIVE

## 2021-09-21 DIAGNOSIS — D126 Benign neoplasm of colon, unspecified: Secondary | ICD-10-CM | POA: Diagnosis not present

## 2021-09-21 DIAGNOSIS — R7309 Other abnormal glucose: Secondary | ICD-10-CM | POA: Diagnosis not present

## 2021-09-21 DIAGNOSIS — D472 Monoclonal gammopathy: Secondary | ICD-10-CM | POA: Diagnosis not present

## 2021-09-21 DIAGNOSIS — I10 Essential (primary) hypertension: Secondary | ICD-10-CM | POA: Diagnosis not present

## 2021-09-21 DIAGNOSIS — Z2820 Immunization not carried out because of patient decision for unspecified reason: Secondary | ICD-10-CM | POA: Diagnosis not present

## 2021-09-21 DIAGNOSIS — R399 Unspecified symptoms and signs involving the genitourinary system: Secondary | ICD-10-CM | POA: Diagnosis not present

## 2021-09-21 DIAGNOSIS — Z7185 Encounter for immunization safety counseling: Secondary | ICD-10-CM | POA: Diagnosis not present

## 2021-10-05 DIAGNOSIS — H401133 Primary open-angle glaucoma, bilateral, severe stage: Secondary | ICD-10-CM | POA: Diagnosis not present

## 2021-10-07 DIAGNOSIS — I10 Essential (primary) hypertension: Secondary | ICD-10-CM | POA: Diagnosis not present

## 2021-10-07 DIAGNOSIS — R7309 Other abnormal glucose: Secondary | ICD-10-CM | POA: Diagnosis not present

## 2021-11-21 DIAGNOSIS — Z2820 Immunization not carried out because of patient decision for unspecified reason: Secondary | ICD-10-CM | POA: Diagnosis not present

## 2021-11-21 DIAGNOSIS — D126 Benign neoplasm of colon, unspecified: Secondary | ICD-10-CM | POA: Diagnosis not present

## 2021-11-21 DIAGNOSIS — I10 Essential (primary) hypertension: Secondary | ICD-10-CM | POA: Diagnosis not present

## 2021-11-21 DIAGNOSIS — E7800 Pure hypercholesterolemia, unspecified: Secondary | ICD-10-CM | POA: Diagnosis not present

## 2021-11-21 DIAGNOSIS — H409 Unspecified glaucoma: Secondary | ICD-10-CM | POA: Diagnosis not present

## 2021-11-22 DIAGNOSIS — R3915 Urgency of urination: Secondary | ICD-10-CM | POA: Diagnosis not present

## 2021-11-22 DIAGNOSIS — I10 Essential (primary) hypertension: Secondary | ICD-10-CM | POA: Diagnosis not present

## 2021-11-22 DIAGNOSIS — R3589 Other polyuria: Secondary | ICD-10-CM | POA: Diagnosis not present

## 2021-11-22 DIAGNOSIS — R339 Retention of urine, unspecified: Secondary | ICD-10-CM | POA: Diagnosis not present

## 2021-11-22 DIAGNOSIS — H409 Unspecified glaucoma: Secondary | ICD-10-CM | POA: Diagnosis not present

## 2021-11-22 DIAGNOSIS — Z125 Encounter for screening for malignant neoplasm of prostate: Secondary | ICD-10-CM | POA: Diagnosis not present

## 2021-11-22 DIAGNOSIS — Z87891 Personal history of nicotine dependence: Secondary | ICD-10-CM | POA: Diagnosis not present

## 2021-11-22 DIAGNOSIS — R399 Unspecified symptoms and signs involving the genitourinary system: Secondary | ICD-10-CM | POA: Diagnosis not present

## 2021-11-22 DIAGNOSIS — G4733 Obstructive sleep apnea (adult) (pediatric): Secondary | ICD-10-CM | POA: Diagnosis not present

## 2021-11-22 DIAGNOSIS — R351 Nocturia: Secondary | ICD-10-CM | POA: Diagnosis not present

## 2021-11-22 DIAGNOSIS — R350 Frequency of micturition: Secondary | ICD-10-CM | POA: Diagnosis not present

## 2021-11-22 DIAGNOSIS — Z8042 Family history of malignant neoplasm of prostate: Secondary | ICD-10-CM | POA: Diagnosis not present

## 2021-12-21 DIAGNOSIS — D126 Benign neoplasm of colon, unspecified: Secondary | ICD-10-CM | POA: Diagnosis not present

## 2021-12-21 DIAGNOSIS — K635 Polyp of colon: Secondary | ICD-10-CM | POA: Diagnosis not present

## 2021-12-21 DIAGNOSIS — Q438 Other specified congenital malformations of intestine: Secondary | ICD-10-CM | POA: Diagnosis not present

## 2021-12-21 DIAGNOSIS — I10 Essential (primary) hypertension: Secondary | ICD-10-CM | POA: Diagnosis not present

## 2021-12-21 DIAGNOSIS — D175 Benign lipomatous neoplasm of intra-abdominal organs: Secondary | ICD-10-CM | POA: Diagnosis not present

## 2021-12-21 DIAGNOSIS — Z86010 Personal history of colonic polyps: Secondary | ICD-10-CM | POA: Diagnosis not present

## 2021-12-21 DIAGNOSIS — K648 Other hemorrhoids: Secondary | ICD-10-CM | POA: Diagnosis not present

## 2021-12-21 DIAGNOSIS — Z1211 Encounter for screening for malignant neoplasm of colon: Secondary | ICD-10-CM | POA: Diagnosis not present

## 2021-12-21 DIAGNOSIS — D123 Benign neoplasm of transverse colon: Secondary | ICD-10-CM | POA: Diagnosis not present

## 2021-12-21 HISTORY — PX: OTHER SURGICAL HISTORY: SHX169

## 2022-01-10 ENCOUNTER — Other Ambulatory Visit: Payer: Self-pay

## 2022-01-10 ENCOUNTER — Other Ambulatory Visit: Payer: PPO

## 2022-01-10 ENCOUNTER — Inpatient Hospital Stay: Payer: PPO | Attending: Internal Medicine

## 2022-01-10 DIAGNOSIS — I10 Essential (primary) hypertension: Secondary | ICD-10-CM | POA: Diagnosis not present

## 2022-01-10 DIAGNOSIS — Z87891 Personal history of nicotine dependence: Secondary | ICD-10-CM | POA: Diagnosis not present

## 2022-01-10 DIAGNOSIS — D472 Monoclonal gammopathy: Secondary | ICD-10-CM | POA: Insufficient documentation

## 2022-01-10 LAB — CBC WITH DIFFERENTIAL/PLATELET
Abs Immature Granulocytes: 0.02 10*3/uL (ref 0.00–0.07)
Basophils Absolute: 0 10*3/uL (ref 0.0–0.1)
Basophils Relative: 0 %
Eosinophils Absolute: 0.1 10*3/uL (ref 0.0–0.5)
Eosinophils Relative: 1 %
HCT: 42 % (ref 39.0–52.0)
Hemoglobin: 14.3 g/dL (ref 13.0–17.0)
Immature Granulocytes: 0 %
Lymphocytes Relative: 33 %
Lymphs Abs: 2.2 10*3/uL (ref 0.7–4.0)
MCH: 31.5 pg (ref 26.0–34.0)
MCHC: 34 g/dL (ref 30.0–36.0)
MCV: 92.5 fL (ref 80.0–100.0)
Monocytes Absolute: 0.4 10*3/uL (ref 0.1–1.0)
Monocytes Relative: 6 %
Neutro Abs: 4 10*3/uL (ref 1.7–7.7)
Neutrophils Relative %: 60 %
Platelets: 252 10*3/uL (ref 150–400)
RBC: 4.54 MIL/uL (ref 4.22–5.81)
RDW: 12.9 % (ref 11.5–15.5)
WBC: 6.7 10*3/uL (ref 4.0–10.5)
nRBC: 0 % (ref 0.0–0.2)

## 2022-01-10 LAB — COMPREHENSIVE METABOLIC PANEL
ALT: 17 U/L (ref 0–44)
AST: 19 U/L (ref 15–41)
Albumin: 4.4 g/dL (ref 3.5–5.0)
Alkaline Phosphatase: 58 U/L (ref 38–126)
Anion gap: 9 (ref 5–15)
BUN: 15 mg/dL (ref 8–23)
CO2: 27 mmol/L (ref 22–32)
Calcium: 9.3 mg/dL (ref 8.9–10.3)
Chloride: 102 mmol/L (ref 98–111)
Creatinine, Ser: 0.74 mg/dL (ref 0.61–1.24)
GFR, Estimated: >60 mL/min (ref >60)
Glucose, Bld: 106 mg/dL — ABNORMAL HIGH (ref 70–99)
Potassium: 3.8 mmol/L (ref 3.5–5.1)
Sodium: 138 mmol/L (ref 135–145)
Total Bilirubin: 0.8 mg/dL (ref 0.3–1.2)
Total Protein: 8.2 g/dL — ABNORMAL HIGH (ref 6.5–8.1)

## 2022-01-11 LAB — KAPPA/LAMBDA LIGHT CHAINS
Kappa free light chain: 20.4 mg/L — ABNORMAL HIGH (ref 3.3–19.4)
Kappa, lambda light chain ratio: 0.75 (ref 0.26–1.65)
Lambda free light chains: 27.1 mg/L — ABNORMAL HIGH (ref 5.7–26.3)

## 2022-01-16 LAB — MULTIPLE MYELOMA PANEL, SERUM
Albumin SerPl Elph-Mcnc: 3.9 g/dL (ref 2.9–4.4)
Albumin/Glob SerPl: 1.2 (ref 0.7–1.7)
Alpha 1: 0.2 g/dL (ref 0.0–0.4)
Alpha2 Glob SerPl Elph-Mcnc: 0.5 g/dL (ref 0.4–1.0)
B-Globulin SerPl Elph-Mcnc: 1 g/dL (ref 0.7–1.3)
Gamma Glob SerPl Elph-Mcnc: 1.8 g/dL (ref 0.4–1.8)
Globulin, Total: 3.5 g/dL (ref 2.2–3.9)
IgA: 287 mg/dL (ref 61–437)
IgG (Immunoglobin G), Serum: 1882 mg/dL — ABNORMAL HIGH (ref 603–1613)
IgM (Immunoglobulin M), Srm: 100 mg/dL (ref 15–143)
M Protein SerPl Elph-Mcnc: 1 g/dL — ABNORMAL HIGH
Total Protein ELP: 7.4 g/dL (ref 6.0–8.5)

## 2022-01-17 ENCOUNTER — Inpatient Hospital Stay: Payer: PPO | Admitting: Internal Medicine

## 2022-01-17 ENCOUNTER — Encounter: Payer: Self-pay | Admitting: Internal Medicine

## 2022-01-17 ENCOUNTER — Other Ambulatory Visit: Payer: Self-pay

## 2022-01-17 DIAGNOSIS — D472 Monoclonal gammopathy: Secondary | ICD-10-CM | POA: Diagnosis not present

## 2022-01-17 NOTE — Progress Notes (Signed)
Patient here for oncology follow-up appointment, expresses no new concerns at this time.    

## 2022-01-17 NOTE — Assessment & Plan Note (Addendum)
#    monoclonal gammopathyof unknown significance (MGUS).M-protein is IgG with lambda light chain specificity. JAN 2023-1.0; K/L=WNL. STABLE.  No evidence of any organ dysfunction.  Continue monitoring on 6 monthly basis.  # HTN: consistently 130-140/80-90s- even at home.  # DISPOSITION: # follow up- in 6 months- MD; 1 week - labs- cbc/cmp/MM panel; K/L light chains- Dr.B

## 2022-01-17 NOTE — Progress Notes (Signed)
Dylan Young NOTE  Patient Care Team: Dylan Kayser, MD (Inactive) as PCP - General (Internal Medicine)  CHIEF COMPLAINTS/PURPOSE OF CONSULTATION: MGUS  # MGUS  # COLONOSCOPY [dec 2022; [facility]]- repeat colo in 2025 Oncology History   No history exists.    HISTORY OF PRESENTING ILLNESS: Ambulating independently.  Accompanied by his wife.  Dylan Young 76 y.o.  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  In the interim patient was evaluated at [facility] for screening colonoscopy.  Procedure uneventful.  Denies any nausea vomiting abdominal pain.  Denies any worsening joint pains or back pain.  Review of Systems  Constitutional:  Negative for chills, diaphoresis, fever, malaise/fatigue and weight loss.  HENT:  Negative for nosebleeds and sore throat.   Eyes:  Negative for double vision.  Respiratory:  Negative for cough, hemoptysis, sputum production, shortness of breath and wheezing.   Cardiovascular:  Negative for chest pain, palpitations, orthopnea and leg swelling.  Gastrointestinal:  Negative for abdominal pain, blood in stool, constipation, diarrhea, heartburn, melena, nausea and vomiting.  Genitourinary:  Negative for dysuria, frequency and urgency.  Musculoskeletal:  Negative for back pain and joint pain.  Skin: Negative.  Negative for itching and rash.  Neurological:  Negative for dizziness, tingling, focal weakness, weakness and headaches.  Endo/Heme/Allergies:  Does not bruise/bleed easily.  Psychiatric/Behavioral:  Negative for depression. The patient is not nervous/anxious and does not have insomnia.     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)    Dysphagia    ED (erectile dysfunction)    Glaucoma    Heart burn    History of stomach ulcers    Hyperlipidemia    Hypogonadism male    Kidney stones    Obesity    Osteoarthritis     Tubular adenoma of colon    Urinary frequency    Urinary frequency    Urinary urgency    Vitamin D deficiency     SURGICAL HISTORY: Past Surgical History:  Procedure Laterality Date   APPENDECTOMY     CATARACT EXTRACTION     Right (1996); Left (2007)   COLONOSCOPY WITH PROPOFOL N/A 05/08/2016   Procedure: COLONOSCOPY WITH PROPOFOL;  Surgeon: Lollie Sails, MD;  Location: [facility] ENDOSCOPY;  Service: Endoscopy;  Laterality: N/A;   Colonoscopy with propofol (N/A)  12/21/2021   EXTRACORPOREAL SHOCK WAVE LITHOTRIPSY Right 05/27/2015   Procedure: EXTRACORPOREAL SHOCK WAVE LITHOTRIPSY (ESWL);  Surgeon: Hollice Espy, MD;  Location: [facility] ORS;  Service: Urology;  Laterality: Right;   EYE SURGERY     LITHOTRIPSY     ployp removal  12/21/2021   5 polylps removed @ [facility]   RETINAL DETACHMENT SURGERY      SOCIAL HISTORY: Social History   Socioeconomic History   Marital status: Married    Spouse name: Not on file   Number of children: Not on file   Years of education: Not on file   Highest education level: Not on file  Occupational History   Not on file  Tobacco Use   Smoking status: Former   Smokeless tobacco: Former    Quit date: 05/26/1975  Vaping Use   Vaping Use: Never used  Substance and Sexual Activity   Alcohol use: Yes    Alcohol/week: 1.0 standard drink    Types: 1 Standard drinks or equivalent per week  Comment: occasional    Drug use: No   Sexual activity: Not on file  Other Topics Concern   Not on file  Social History Narrative   Not on file   Social Determinants of Health   Financial Resource Strain: Not on file  Food Insecurity: Not on file  Transportation Needs: Not on file  Physical Activity: Not on file  Stress: Not on file  Social Connections: Not on file  Intimate Partner Violence: Not on file    FAMILY HISTORY: Family History  Problem Relation Age of Onset   Prostate cancer Father    Alzheimer's disease Father    Diabetes Mother     Stroke Mother     ALLERGIES:  has No Known Allergies.  MEDICATIONS:  Current Outpatient Medications  Medication Sig Dispense Refill   B Complex Vitamins (VITAMIN B COMPLEX) TABS Take 1 tablet by mouth daily.     brimonidine (ALPHAGAN) 0.2 % ophthalmic solution 1 drop 2 (two) times daily.     bromfenac (XIBROM) 0.09 % ophthalmic solution Apply 1 drop to eye 2 (two) times daily.      Cholecalciferol (VITAMIN D-3) 1000 UNITS CAPS Take 2 capsules by mouth daily.      Docosahexaenoic Acid (DHA COMPLETE PO) Take by mouth.     Dorzolamide HCl-Timolol Mal PF 22.3-6.8 MG/ML SOLN Place 1 drop into both eyes 2 (two) times daily.     latanoprost (XALATAN) 0.005 % ophthalmic solution Apply 1 drop to eye at bedtime.      Multiple Vitamins-Minerals (MULTIVITAMIN ADULT PO) Take 1 tablet by mouth daily.     ondansetron (ZOFRAN-ODT) 4 MG disintegrating tablet Take 1 tablet (4 mg total) by mouth every 8 (eight) hours as needed for nausea or vomiting. 20 tablet 0   ROCKLATAN 0.02-0.005 % SOLN Apply 1 drop to eye at bedtime.     Vit B6-Vit B12-Omega 3 Acids (VITAMIN B PLUS+ PO) Take 1 tablet by mouth daily.     vitamin C (ASCORBIC ACID) 500 MG tablet Take 2,000 mg by mouth daily.      vitamin E 400 UNIT capsule Take 400 Units by mouth daily.     benzonatate (TESSALON) 100 MG capsule Take 2 capsules (200 mg total) by mouth every 8 (eight) hours. (Patient not taking: Reported on 01/17/2022) 21 capsule 0   brompheniramine-pseudoephedrine-DM 30-2-10 MG/5ML syrup Take 10 mLs by mouth 4 (four) times daily as needed. (Patient not taking: Reported on 01/17/2022) 200 mL 0   ipratropium (ATROVENT) 0.06 % nasal spray Place 2 sprays into both nostrils 4 (four) times daily. (Patient not taking: Reported on 01/17/2022) 15 mL 12   losartan (COZAAR) 50 MG tablet Take 50 mg by mouth daily.     Zinc 50 MG CAPS Take by mouth daily.     No current facility-administered medications for this visit.      Marland Kitchen  PHYSICAL  EXAMINATION: ECOG PERFORMANCE STATUS: 0 - Asymptomatic  Vitals:   01/17/22 1050  BP: (!) 149/86  Pulse: 64  Resp: 18  Temp: (!) 97.1 F (36.2 C)  SpO2: 100%   Filed Weights   01/17/22 1050  Weight: 163 lb (73.9 kg)    Physical Exam Vitals and nursing note reviewed.  Constitutional:      Comments:       HENT:     Head: Normocephalic and atraumatic.     Mouth/Throat:     Pharynx: Oropharynx is clear.  Eyes:     Extraocular Movements: Extraocular movements  intact.     Pupils: Pupils are equal, round, and reactive to light.  Cardiovascular:     Rate and Rhythm: Normal rate and regular rhythm.  Pulmonary:     Comments: Decreased breath sounds bilaterally.  Abdominal:     Palpations: Abdomen is soft.  Musculoskeletal:        General: Normal range of motion.     Cervical back: Normal range of motion.  Skin:    General: Skin is warm.  Neurological:     General: No focal deficit present.     Mental Status: He is alert and oriented to person, place, and time.  Psychiatric:        Behavior: Behavior normal.        Judgment: Judgment normal.     LABORATORY DATA:  I have reviewed the data as listed Lab Results  Component Value Date   WBC 6.7 01/10/2022   HGB 14.3 01/10/2022   HCT 42.0 01/10/2022   MCV 92.5 01/10/2022   PLT 252 01/10/2022   Recent Labs    07/19/21 0957 01/10/22 1133  NA 136 138  K 4.2 3.8  CL 101 102  CO2 27 27  GLUCOSE 116* 106*  BUN 15 15  CREATININE 0.82 0.74  CALCIUM 9.2 9.3  GFRNONAA >60 >60  PROT 8.1 8.2*  ALBUMIN 4.2 4.4  AST 24 19  ALT 21 17  ALKPHOS 59 58  BILITOT 0.9 0.8    RADIOGRAPHIC STUDIES: I have personally reviewed the radiological images as listed and agreed with the findings in the report. No results found.  ASSESSMENT & PLAN:   Monoclonal gammopathy of unknown significance (MGUS) #  monoclonal gammopathy of unknown significance (MGUS).  M-protein is IgG with lambda light chain specificity. JAN 2023-1.0;  K/L=WNL. STABLE.  No evidence of any organ dysfunction.  Continue monitoring on 6 monthly basis.  # HTN: consistently 130-140/80-90s- even at home.  # DISPOSITION: # follow up- in 6 months- MD; 1 week - labs- cbc/cmp/MM panel; K/L light chains- Dr.B        All questions were answered. The patient knows to call the clinic with any problems, questions or concerns.       Cammie Sickle, MD 01/17/2022 11:18 AM

## 2022-07-11 ENCOUNTER — Inpatient Hospital Stay: Payer: PPO | Attending: Oncology

## 2022-07-11 DIAGNOSIS — M1990 Unspecified osteoarthritis, unspecified site: Secondary | ICD-10-CM | POA: Insufficient documentation

## 2022-07-11 DIAGNOSIS — D472 Monoclonal gammopathy: Secondary | ICD-10-CM | POA: Insufficient documentation

## 2022-07-11 DIAGNOSIS — E559 Vitamin D deficiency, unspecified: Secondary | ICD-10-CM | POA: Diagnosis not present

## 2022-07-11 DIAGNOSIS — Z87891 Personal history of nicotine dependence: Secondary | ICD-10-CM | POA: Diagnosis not present

## 2022-07-11 DIAGNOSIS — Z8042 Family history of malignant neoplasm of prostate: Secondary | ICD-10-CM | POA: Diagnosis not present

## 2022-07-11 LAB — CBC WITH DIFFERENTIAL/PLATELET
Abs Immature Granulocytes: 0.01 10*3/uL (ref 0.00–0.07)
Basophils Absolute: 0 10*3/uL (ref 0.0–0.1)
Basophils Relative: 0 %
Eosinophils Absolute: 0.1 10*3/uL (ref 0.0–0.5)
Eosinophils Relative: 1 %
HCT: 40.9 % (ref 39.0–52.0)
Hemoglobin: 13.9 g/dL (ref 13.0–17.0)
Immature Granulocytes: 0 %
Lymphocytes Relative: 36 %
Lymphs Abs: 2.3 10*3/uL (ref 0.7–4.0)
MCH: 32.1 pg (ref 26.0–34.0)
MCHC: 34 g/dL (ref 30.0–36.0)
MCV: 94.5 fL (ref 80.0–100.0)
Monocytes Absolute: 0.5 10*3/uL (ref 0.1–1.0)
Monocytes Relative: 8 %
Neutro Abs: 3.5 10*3/uL (ref 1.7–7.7)
Neutrophils Relative %: 55 %
Platelets: 263 10*3/uL (ref 150–400)
RBC: 4.33 MIL/uL (ref 4.22–5.81)
RDW: 12.1 % (ref 11.5–15.5)
WBC: 6.3 10*3/uL (ref 4.0–10.5)
nRBC: 0 % (ref 0.0–0.2)

## 2022-07-11 LAB — COMPREHENSIVE METABOLIC PANEL
ALT: 16 U/L (ref 0–44)
AST: 22 U/L (ref 15–41)
Albumin: 4.1 g/dL (ref 3.5–5.0)
Alkaline Phosphatase: 79 U/L (ref 38–126)
Anion gap: 4 — ABNORMAL LOW (ref 5–15)
BUN: 15 mg/dL (ref 8–23)
CO2: 26 mmol/L (ref 22–32)
Calcium: 8.6 mg/dL — ABNORMAL LOW (ref 8.9–10.3)
Chloride: 108 mmol/L (ref 98–111)
Creatinine, Ser: 0.99 mg/dL (ref 0.61–1.24)
GFR, Estimated: >60 mL/min (ref >60)
Glucose, Bld: 109 mg/dL — ABNORMAL HIGH (ref 70–99)
Potassium: 3.8 mmol/L (ref 3.5–5.1)
Sodium: 138 mmol/L (ref 135–145)
Total Bilirubin: 0.8 mg/dL (ref 0.3–1.2)
Total Protein: 8 g/dL (ref 6.5–8.1)

## 2022-07-12 LAB — KAPPA/LAMBDA LIGHT CHAINS
Kappa free light chain: 22 mg/L — ABNORMAL HIGH (ref 3.3–19.4)
Kappa, lambda light chain ratio: 0.76 (ref 0.26–1.65)
Lambda free light chains: 29.1 mg/L — ABNORMAL HIGH (ref 5.7–26.3)

## 2022-07-14 LAB — MULTIPLE MYELOMA PANEL, SERUM
Albumin SerPl Elph-Mcnc: 3.6 g/dL (ref 2.9–4.4)
Albumin/Glob SerPl: 1 (ref 0.7–1.7)
Alpha 1: 0.2 g/dL (ref 0.0–0.4)
Alpha2 Glob SerPl Elph-Mcnc: 0.6 g/dL (ref 0.4–1.0)
B-Globulin SerPl Elph-Mcnc: 1 g/dL (ref 0.7–1.3)
Gamma Glob SerPl Elph-Mcnc: 1.9 g/dL — ABNORMAL HIGH (ref 0.4–1.8)
Globulin, Total: 3.8 g/dL (ref 2.2–3.9)
IgA: 293 mg/dL (ref 61–437)
IgG (Immunoglobin G), Serum: 1951 mg/dL — ABNORMAL HIGH (ref 603–1613)
IgM (Immunoglobulin M), Srm: 92 mg/dL (ref 15–143)
M Protein SerPl Elph-Mcnc: 1 g/dL — ABNORMAL HIGH
Total Protein ELP: 7.4 g/dL (ref 6.0–8.5)

## 2022-07-18 ENCOUNTER — Ambulatory Visit: Payer: PPO | Admitting: Internal Medicine

## 2022-07-18 ENCOUNTER — Encounter: Payer: Self-pay | Admitting: Medical Oncology

## 2022-07-18 ENCOUNTER — Inpatient Hospital Stay: Payer: PPO | Admitting: Medical Oncology

## 2022-07-18 VITALS — BP 139/82 | HR 57 | Temp 98.7°F | Resp 20 | Wt 156.6 lb

## 2022-07-18 DIAGNOSIS — D472 Monoclonal gammopathy: Secondary | ICD-10-CM

## 2022-07-18 NOTE — Progress Notes (Signed)
[place] NOTE  Patient Care Team: Ezequiel Kayser, MD as PCP - General (Internal Medicine)  CHIEF COMPLAINTS/PURPOSE OF CONSULTATION: MGUS  # MGUS  # COLONOSCOPY [dec 2022; [facility]]- repeat colo in 2025 Oncology History   No history exists.    HISTORY OF PRESENTING ILLNESS: Ambulating independently.  Accompanied by his wife.  Dylan Young 76 y.o.  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  Patient states that he is doing well. No bone pains, night sweats, unintentional weight loss.   Review of Systems  Constitutional:  Negative for chills, diaphoresis, fever, malaise/fatigue and weight loss.  HENT:  Negative for nosebleeds and sore throat.   Eyes:  Negative for double vision.  Respiratory:  Negative for cough, hemoptysis, sputum production, shortness of breath and wheezing.   Cardiovascular:  Negative for chest pain, palpitations, orthopnea and leg swelling.  Gastrointestinal:  Negative for abdominal pain, blood in stool, constipation, diarrhea, heartburn, melena, nausea and vomiting.  Genitourinary:  Negative for dysuria, frequency and urgency.  Musculoskeletal:  Negative for back pain and joint pain.  Skin: Negative.  Negative for itching and rash.  Neurological:  Negative for dizziness, tingling, focal weakness, weakness and headaches.  Endo/Heme/Allergies:  Does not bruise/bleed easily.  Psychiatric/Behavioral:  Negative for depression. The patient is not nervous/anxious and does not have insomnia.      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)    Dysphagia    ED (erectile dysfunction)    Glaucoma    Heart burn    History of stomach ulcers    Hyperlipidemia    Hypogonadism male    Kidney stones    Obesity    Osteoarthritis    Tubular adenoma of colon    Urinary frequency    Urinary frequency    Urinary urgency     Vitamin D deficiency     SURGICAL HISTORY: Past Surgical History:  Procedure Laterality Date   APPENDECTOMY     CATARACT EXTRACTION     Right (1996); Left (2007)   COLONOSCOPY WITH PROPOFOL N/A 05/08/2016   Procedure: COLONOSCOPY WITH PROPOFOL;  Surgeon: Lollie Sails, MD;  Location: [facility] ENDOSCOPY;  Service: Endoscopy;  Laterality: N/A;   Colonoscopy with propofol (N/A)  12/21/2021   EXTRACORPOREAL SHOCK WAVE LITHOTRIPSY Right 05/27/2015   Procedure: EXTRACORPOREAL SHOCK WAVE LITHOTRIPSY (ESWL);  Surgeon: Hollice Espy, MD;  Location: [facility] ORS;  Service: Urology;  Laterality: Right;   EYE SURGERY     LITHOTRIPSY     ployp removal  12/21/2021   5 polylps removed @ [facility]   RETINAL DETACHMENT SURGERY      SOCIAL HISTORY: Social History   Socioeconomic History   Marital status: Married    Spouse name: Not on file   Number of children: Not on file   Years of education: Not on file   Highest education level: Not on file  Occupational History   Not on file  Tobacco Use   Smoking status: Former   Smokeless tobacco: Former    Quit date: 05/26/1975  Vaping Use   Vaping Use: Never used  Substance and Sexual Activity   Alcohol use: Yes    Alcohol/week: 1.0 standard drink of alcohol    Types: 1 Standard drinks or equivalent per week    Comment: occasional    Drug use: No   Sexual activity:  Not on file  Other Topics Concern   Not on file  Social History Narrative   Not on file   Social Determinants of Health   Financial Resource Strain: Not on file  Food Insecurity: Not on file  Transportation Needs: Not on file  Physical Activity: Not on file  Stress: Not on file  Social Connections: Not on file  Intimate Partner Violence: Not on file    FAMILY HISTORY: Family History  Problem Relation Age of Onset   Prostate cancer Father    Alzheimer's disease Father    Diabetes Mother    Stroke Mother     ALLERGIES:  has No Known Allergies.  MEDICATIONS:  Current  Outpatient Medications  Medication Sig Dispense Refill   amLODipine (NORVASC) 5 MG tablet Take 5 mg by mouth daily.     B Complex Vitamins (VITAMIN B COMPLEX) TABS Take 1 tablet by mouth daily.     brimonidine (ALPHAGAN) 0.2 % ophthalmic solution 1 drop 2 (two) times daily.     bromfenac (XIBROM) 0.09 % ophthalmic solution Apply 1 drop to eye 2 (two) times daily.      Cholecalciferol (VITAMIN D-3) 1000 UNITS CAPS Take 2 capsules by mouth daily.      Docosahexaenoic Acid (DHA COMPLETE PO) Take by mouth.     Dorzolamide HCl-Timolol Mal PF 22.3-6.8 MG/ML SOLN Place 1 drop into both eyes 2 (two) times daily.     latanoprost (XALATAN) 0.005 % ophthalmic solution Apply 1 drop to eye at bedtime.      losartan (COZAAR) 50 MG tablet Take 50 mg by mouth daily.     Multiple Vitamins-Minerals (MULTIVITAMIN ADULT PO) Take 1 tablet by mouth daily.     ROCKLATAN 0.02-0.005 % SOLN Apply 1 drop to eye at bedtime.     Vit B6-Vit B12-Omega 3 Acids (VITAMIN B PLUS+ PO) Take 1 tablet by mouth daily.     vitamin C (ASCORBIC ACID) 500 MG tablet Take 2,000 mg by mouth daily.      vitamin E 400 UNIT capsule Take 400 Units by mouth daily.     Zinc 50 MG CAPS Take by mouth daily.     benzonatate (TESSALON) 100 MG capsule Take 2 capsules (200 mg total) by mouth every 8 (eight) hours. (Patient not taking: Reported on 01/17/2022) 21 capsule 0   brompheniramine-pseudoephedrine-DM 30-2-10 MG/5ML syrup Take 10 mLs by mouth 4 (four) times daily as needed. (Patient not taking: Reported on 01/17/2022) 200 mL 0   ipratropium (ATROVENT) 0.06 % nasal spray Place 2 sprays into both nostrils 4 (four) times daily. (Patient not taking: Reported on 01/17/2022) 15 mL 12   ondansetron (ZOFRAN-ODT) 4 MG disintegrating tablet Take 1 tablet (4 mg total) by mouth every 8 (eight) hours as needed for nausea or vomiting. (Patient not taking: Reported on 07/18/2022) 20 tablet 0   No current facility-administered medications for this visit.       Marland Kitchen  PHYSICAL EXAMINATION: ECOG PERFORMANCE STATUS: 0 - Asymptomatic  Vitals:   07/18/22 1028  BP: 139/82  Pulse: (!) 57  Resp: 20  Temp: 98.7 F (37.1 C)  SpO2: 100%   Filed Weights   07/18/22 1028  Weight: 156 lb 9.6 oz (71 kg)    Physical Exam Vitals and nursing note reviewed.  Constitutional:      Comments:       HENT:     Head: Normocephalic and atraumatic.     Mouth/Throat:     Pharynx: Oropharynx is clear.  Eyes:     Extraocular Movements: Extraocular movements intact.     Pupils: Pupils are equal, round, and reactive to light.  Cardiovascular:     Rate and Rhythm: Normal rate and regular rhythm.  Pulmonary:     Comments: Decreased breath sounds bilaterally.  Abdominal:     Palpations: Abdomen is soft.  Musculoskeletal:        General: Normal range of motion.     Cervical back: Normal range of motion.  Skin:    General: Skin is warm.  Neurological:     General: No focal deficit present.     Mental Status: He is alert and oriented to person, place, and time.  Psychiatric:        Behavior: Behavior normal.        Judgment: Judgment normal.      LABORATORY DATA:  I have reviewed the data as listed Lab Results  Component Value Date   WBC 6.3 07/11/2022   HGB 13.9 07/11/2022   HCT 40.9 07/11/2022   MCV 94.5 07/11/2022   PLT 263 07/11/2022   Recent Labs    07/19/21 0957 01/10/22 1133 07/11/22 1105  NA 136 138 138  K 4.2 3.8 3.8  CL 101 102 108  CO2 '27 27 26  '$ GLUCOSE 116* 106* 109*  BUN '15 15 15  '$ CREATININE 0.82 0.74 0.99  CALCIUM 9.2 9.3 8.6*  GFRNONAA >60 >60 >60  PROT 8.1 8.2* 8.0  ALBUMIN 4.2 4.4 4.1  AST '24 19 22  '$ ALT '21 17 16  '$ ALKPHOS 59 58 79  BILITOT 0.9 0.8 0.8    RADIOGRAPHIC STUDIES: I have personally reviewed the radiological images as listed and agreed with the findings in the report. No results found.  ASSESSMENT & PLAN:  Encounter Diagnosis  Name Primary?   Monoclonal gammopathy of unknown significance (MGUS)  Yes   Chronic in nature. Labs reviewed and discussed with patient. Essentially stable with no evidence of end organ damage. Continue active surveillance.    All questions were answered. The patient knows to call the clinic with any problems, questions or concerns.   Hughie Closs, PA-C 07/18/2022 11:25 AM

## 2022-10-06 ENCOUNTER — Ambulatory Visit
Admission: RE | Admit: 2022-10-06 | Discharge: 2022-10-06 | Disposition: A | Discharge status: Home / Self Care | Payer: PPO | Source: Ambulatory Visit | Attending: Physician Assistant | Admitting: Physician Assistant

## 2022-10-06 VITALS — BP 120/75 | HR 70 | Temp 98.0°F | Ht 66.0 in | Wt 155.0 lb

## 2022-10-06 DIAGNOSIS — L509 Urticaria, unspecified: Secondary | ICD-10-CM

## 2022-10-06 MED ORDER — METHYLPREDNISOLONE SODIUM SUCC 125 MG IJ SOLR
80.0000 mg | Freq: Once | INTRAMUSCULAR | Status: AC
  Administered 2022-10-06: 80 mg via INTRAMUSCULAR

## 2022-10-06 NOTE — Discharge Instructions (Addendum)
-  You have hives.  See the handout.  As we discussed, there are a lot of different causes of hives and they are not all due to allergies. - We have given you an injection of a corticosteroid in the clinic tonight. - Take Benadryl 1 to 2 tablets every 6 hours as needed until the rash goes away and for about a day afterwards. - May also take nondrowsy Claritin during the day - Cool compresses to the area and try not to scratch. - If this is recurrent you should follow-up with PCP and discuss allergy testing. - If you ever develop any facial, throat swelling or breathing difficulty you should call 911 or have someone take immediately to the ER.

## 2022-10-06 NOTE — ED Provider Notes (Signed)
[facility]    CSN: [id number] Arrival date & time: 10/06/22  1457      History   Chief Complaint Chief Complaint  Patient presents with   Rash    HPI Dylan Young is a 76 y.o. male presenting with his wife for concerns about pruritic rash that started last night.  He reports that he has developed large red whelps that are intensely itchy all over his back, abdomen, thighs and buttocks.  He denies any change in medications or new medications, detergents, soaps or body washes, colognes.  He is never had this sort of reaction before.  He denies being stung by an insect or bitten by a tick.  He has not treated his condition in any way yet.  He is as he was not sure what to take.  He denies any swelling of his throat or face or breathing difficulty or wheezing, chest tightness.  No recent illnesses or URI symptoms now. No other concerns.  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   Glaucoma 05/26/2015   Arthritis, degenerative 05/26/2015   Tubular adenoma of colon 05/26/2015   Benign prostatic hyperplasia with urinary obstruction 03/08/2015    Past Surgical History:  Procedure Laterality Date   APPENDECTOMY     CATARACT EXTRACTION     Right (1996); Left (2007)   COLONOSCOPY WITH PROPOFOL N/A 05/08/2016   Procedure: COLONOSCOPY WITH PROPOFOL;  Surgeon: Lollie Sails, MD;  Location: [facility] ENDOSCOPY;  Service: Endoscopy;   Laterality: N/A;   Colonoscopy with propofol (N/A)  12/21/2021   EXTRACORPOREAL SHOCK WAVE LITHOTRIPSY Right 05/27/2015   Procedure: EXTRACORPOREAL SHOCK WAVE LITHOTRIPSY (ESWL);  Surgeon: Hollice Espy, MD;  Location: [facility] ORS;  Service: Urology;  Laterality: Right;   EYE SURGERY     LITHOTRIPSY     ployp removal  12/21/2021   5 polylps removed @ [facility] Medications    Prior to Admission medications   Medication Sig Start Date End Date Taking? Authorizing Provider  amLODipine (NORVASC) 5 MG tablet Take 5 mg by mouth daily. 05/19/22   [provider]  B Complex Vitamins (VITAMIN B COMPLEX) TABS Take 1 tablet by mouth daily.    [provider]  brimonidine (ALPHAGAN) 0.2 % ophthalmic solution 1 drop 2 (two) times daily. 05/01/21   [provider]  bromfenac (XIBROM) 0.09 % ophthalmic solution Apply 1 drop to eye 2 (two) times daily.     [provider]  Cholecalciferol (VITAMIN D-3) 1000 UNITS CAPS Take 2 capsules by mouth daily.     [provider]  Docosahexaenoic Acid (DHA COMPLETE PO) Take by mouth.    [provider]  Dorzolamide HCl-Timolol Mal PF 22.3-6.8 MG/ML SOLN  Place 1 drop into both eyes 2 (two) times daily.    [provider]  latanoprost (XALATAN) 0.005 % ophthalmic solution Apply 1 drop to eye at bedtime.  01/10/08   [provider]  losartan (COZAAR) 50 MG tablet Take 50 mg by mouth daily. 11/21/21   [provider]  Multiple Vitamins-Minerals (MULTIVITAMIN ADULT PO) Take 1 tablet by mouth daily.    [provider]  ROCKLATAN 0.02-0.005 % SOLN Apply 1 drop to eye at bedtime. 07/07/21   [provider]  Vit B6-Vit B12-Omega 3 Acids (VITAMIN B PLUS+ PO) Take 1 tablet by mouth daily.    [provider]  vitamin C (ASCORBIC ACID) 500 MG tablet Take 2,000 mg by mouth daily.     [provider]  vitamin E 400 UNIT capsule Take  400 Units by mouth daily.    [provider]  Zinc 50 MG CAPS Take by mouth daily.    [provider]    Family History Family History  Problem Relation Age of Onset   Prostate cancer Father    Alzheimer's disease Father    Diabetes Mother    Stroke Mother     Social History Social History   Tobacco Use   Smoking status: Former   Smokeless tobacco: Former    Quit date: 05/26/1975  Vaping Use   Vaping Use: Never used  Substance Use Topics   Alcohol use: Yes    Alcohol/week: 1.0 standard drink of alcohol    Types: 1 Standard drinks or equivalent per week    Comment: occasional    Drug use: No     Allergies   Patient has no known allergies.   Review of Systems Review of Systems  Constitutional:  Negative for fatigue and fever.  HENT:  Negative for congestion, facial swelling, rhinorrhea and trouble swallowing.   Respiratory:  Negative for cough, chest tightness, shortness of breath and wheezing.   Cardiovascular:  Negative for chest pain.  Musculoskeletal:  Negative for myalgias.  Skin:  Positive for rash.  Neurological:  Negative for headaches.     Physical Exam Triage Vital Signs ED Triage Vitals  Enc Vitals Group     BP 10/06/22 1511 120/75     Pulse Rate 10/06/22 1511 70     Resp --      Temp 10/06/22 1511 98 F (36.7 C)     Temp Source 10/06/22 1511 Oral     SpO2 10/06/22 1511 100 %     Weight 10/06/22 1510 155 lb (70.3 kg)     Height 10/06/22 1510 '5\' 6"'$  (1.676 m)     Head Circumference --      Peak Flow --      Pain Score 10/06/22 1510 0     Pain Loc --      Pain Edu? --      Excl. in [place]? --    No data found.  Updated Vital Signs BP 120/75 (BP Location: Left Arm)   Pulse 70   Temp 98 F (36.7 C) (Oral)   Ht '5\' 6"'$  (1.676 m)   Wt 155 lb (70.3 kg)   SpO2 100%   BMI 25.02 kg/m      Physical Exam Vitals and nursing note reviewed.  Constitutional:      General: He is not in acute distress.    Appearance: Normal  appearance. He is well-developed. He is not ill-appearing.  HENT:     Head: Normocephalic and atraumatic.  Nose: Nose normal.     Mouth/Throat:     Mouth: Mucous membranes are moist.     Pharynx: Oropharynx is clear.  Eyes:     General: No scleral icterus.    Conjunctiva/sclera: Conjunctivae normal.  Cardiovascular:     Rate and Rhythm: Normal rate and regular rhythm.     Heart sounds: Normal heart sounds.  Pulmonary:     Effort: Pulmonary effort is normal. No respiratory distress.     Breath sounds: Normal breath sounds.  Musculoskeletal:     Cervical back: Neck supple.  Skin:    General: Skin is warm and dry.     Capillary Refill: Capillary refill takes less than 2 seconds.     Findings: Rash present.     Comments: Erythematous urticarial rash of abdomen, chest, back, thighs and buttocks.  Most of the hives are located on his flanks.  Neurological:     General: No focal deficit present.     Mental Status: He is alert. Mental status is at baseline.     Motor: No weakness.     Gait: Gait normal.  Psychiatric:        Mood and Affect: Mood normal.        Behavior: Behavior normal.      UC Treatments / Results  Labs (all labs ordered are listed, but only abnormal results are displayed) Labs Reviewed - No data to display  EKG   Radiology No results found.  Procedures Procedures (including critical care time)  Medications Ordered in UC Medications  methylPREDNISolone sodium succinate (SOLU-MEDROL) 125 mg/2 mL injection 80 mg (has no administration in time range)    Initial Impression / Assessment and Plan / UC Course  I have reviewed the triage vital signs and the nursing notes.  Pertinent labs & imaging results that were available during my care of the patient were reviewed by me and considered in my medical decision making (see chart for details).   76 year old male presenting for urticarial rash of the torso, thighs and buttocks that started last night.  He  cannot identify any potential triggers.  He is denying any throat or facial swelling, chest tightness or breathing difficulty.  No similar reactions.  Not treating condition in any way yet.  His vitals are all normal and stable.  He is overall well-appearing.  He has no swelling of his face or throat.  Chest clear auscultation heart regular rate and rhythm.  Reticular rash diffusely throughout torso, thighs and buttocks.  Unsure as to the cause of patient's hives.  Patient given 80 mg IM Solu-Medrol in clinic.  Advised to start antihistamines right away.  Advised to continue antihistamine regimen until rash resolves and for a day afterwards.  Has cool compresses and try not scratch.  Advised if rash does not go away or return to follow-up with PCP for evaluation.  Advise going to emergency department or calling 911 for any acute worsening of his symptoms.   Final Clinical Impressions(s) / UC Diagnoses   Final diagnoses:  Urticaria     Discharge Instructions      -You have hives.  See the handout.  As we discussed, there are a lot of different causes of hives and they are not all due to allergies. - We have given you an injection of a corticosteroid in the clinic tonight. - Take Benadryl 1 to 2 tablets every 6 hours as needed until the rash goes away and for about a day afterwards. -  May also take nondrowsy Claritin during the day - Cool compresses to the area and try not to scratch. - If this is recurrent you should follow-up with PCP and discuss allergy testing. - If you ever develop any facial, throat swelling or breathing difficulty you should call 911 or have someone take immediately to the ER.    ED Prescriptions   None    PDMP not reviewed this encounter.   Danton Clap, PA-C 10/06/22 1555

## 2022-10-06 NOTE — ED Triage Notes (Signed)
Patient reports that he has a rash up both of his sides, legs, buttocks, arms. Patient reports that it is itchy.   Patient denies any new detergents or soaps.

## 2023-01-19 ENCOUNTER — Inpatient Hospital Stay: Payer: PPO | Attending: Internal Medicine

## 2023-01-19 DIAGNOSIS — Z8042 Family history of malignant neoplasm of prostate: Secondary | ICD-10-CM | POA: Diagnosis not present

## 2023-01-19 DIAGNOSIS — Z87891 Personal history of nicotine dependence: Secondary | ICD-10-CM | POA: Insufficient documentation

## 2023-01-19 DIAGNOSIS — I10 Essential (primary) hypertension: Secondary | ICD-10-CM | POA: Diagnosis not present

## 2023-01-19 DIAGNOSIS — D472 Monoclonal gammopathy: Secondary | ICD-10-CM

## 2023-01-19 LAB — COMPREHENSIVE METABOLIC PANEL
ALT: 16 U/L (ref 0–44)
AST: 22 U/L (ref 15–41)
Albumin: 4.2 g/dL (ref 3.5–5.0)
Alkaline Phosphatase: 64 U/L (ref 38–126)
Anion gap: 9 (ref 5–15)
BUN: 16 mg/dL (ref 8–23)
CO2: 24 mmol/L (ref 22–32)
Calcium: 9 mg/dL (ref 8.9–10.3)
Chloride: 104 mmol/L (ref 98–111)
Creatinine, Ser: 0.81 mg/dL (ref 0.61–1.24)
GFR, Estimated: >60 mL/min (ref >60)
Glucose, Bld: 90 mg/dL (ref 70–99)
Potassium: 4.1 mmol/L (ref 3.5–5.1)
Sodium: 137 mmol/L (ref 135–145)
Total Bilirubin: 0.7 mg/dL (ref 0.3–1.2)
Total Protein: 8.2 g/dL — ABNORMAL HIGH (ref 6.5–8.1)

## 2023-01-19 LAB — CBC WITH DIFFERENTIAL/PLATELET
Abs Immature Granulocytes: 0.02 10*3/uL (ref 0.00–0.07)
Basophils Absolute: 0 10*3/uL (ref 0.0–0.1)
Basophils Relative: 0 %
Eosinophils Absolute: 0.1 10*3/uL (ref 0.0–0.5)
Eosinophils Relative: 1 %
HCT: 41.7 % (ref 39.0–52.0)
Hemoglobin: 14 g/dL (ref 13.0–17.0)
Immature Granulocytes: 0 %
Lymphocytes Relative: 33 %
Lymphs Abs: 2.4 10*3/uL (ref 0.7–4.0)
MCH: 31.1 pg (ref 26.0–34.0)
MCHC: 33.6 g/dL (ref 30.0–36.0)
MCV: 92.7 fL (ref 80.0–100.0)
Monocytes Absolute: 0.5 10*3/uL (ref 0.1–1.0)
Monocytes Relative: 7 %
Neutro Abs: 4.2 10*3/uL (ref 1.7–7.7)
Neutrophils Relative %: 59 %
Platelets: 253 10*3/uL (ref 150–400)
RBC: 4.5 MIL/uL (ref 4.22–5.81)
RDW: 12.7 % (ref 11.5–15.5)
WBC: 7.3 10*3/uL (ref 4.0–10.5)
nRBC: 0 % (ref 0.0–0.2)

## 2023-01-22 ENCOUNTER — Encounter: Payer: Self-pay | Admitting: Internal Medicine

## 2023-01-22 ENCOUNTER — Inpatient Hospital Stay: Payer: PPO | Admitting: Internal Medicine

## 2023-01-22 VITALS — BP 127/79 | HR 73 | Temp 96.6°F | Resp 16 | Wt 160.6 lb

## 2023-01-22 DIAGNOSIS — D472 Monoclonal gammopathy: Secondary | ICD-10-CM | POA: Diagnosis not present

## 2023-01-22 LAB — KAPPA/LAMBDA LIGHT CHAINS
Kappa free light chain: 20 mg/L — ABNORMAL HIGH (ref 3.3–19.4)
Kappa, lambda light chain ratio: 0.73 (ref 0.26–1.65)
Lambda free light chains: 27.3 mg/L — ABNORMAL HIGH (ref 5.7–26.3)

## 2023-01-22 NOTE — Progress Notes (Signed)
Patient denies new problems/concerns today.   °

## 2023-01-22 NOTE — Assessment & Plan Note (Signed)
#  Monoclonal gammopathy of unknown significance (MGUS).  M-protein is IgG with lambda light chain specificity.  No evidence of any organ dysfunction. Continue monitoring on 6 monthly basis.  # JULY  2023-1.0; K/L=WNL.  Cbc/cmp; WNL: MM/K/L light chains  from today pending.  # HTN: consistently 130-140/80-90s- even at home.  # DISPOSITION: # follow up- in 6 months- MD; 1 week - labs- cbc/cmp/MM panel; K/L light chains- Dr.B

## 2023-01-22 NOTE — Progress Notes (Signed)
[place] NOTE  Patient Care Team: Bubba Camp, FNP as PCP - General (Family Medicine)  CHIEF COMPLAINTS/PURPOSE OF CONSULTATION: MGUS  # MGUS  # COLONOSCOPY [dec 2022; [facility]]- repeat colo in 2025 Oncology History   No history exists.    HISTORY OF PRESENTING ILLNESS: Ambulating independently.  Accompanied by his wife.  Dylan Young 77 y.o.  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   Denies any worsening joint pains or nausea vomiting. No fatigue.   Review of Systems  Constitutional:  Negative for chills, diaphoresis, fever, malaise/fatigue and weight loss.  HENT:  Negative for nosebleeds and sore throat.   Eyes:  Negative for double vision.  Respiratory:  Negative for cough, hemoptysis, sputum production, shortness of breath and wheezing.   Cardiovascular:  Negative for chest pain, palpitations, orthopnea and leg swelling.  Gastrointestinal:  Negative for abdominal pain, blood in stool, constipation, diarrhea, heartburn, melena, nausea and vomiting.  Genitourinary:  Negative for dysuria, frequency and urgency.  Musculoskeletal:  Negative for back pain and joint pain.  Skin: Negative.  Negative for itching and rash.  Neurological:  Negative for dizziness, tingling, focal weakness, weakness and headaches.  Endo/Heme/Allergies:  Does not bruise/bleed easily.  Psychiatric/Behavioral:  Negative for depression. The patient is not nervous/anxious and does not have insomnia.      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)    Dysphagia    ED (erectile dysfunction)    Glaucoma    Heart burn    History of stomach ulcers    Hyperlipidemia    Hypogonadism male    Kidney stones    Obesity    Osteoarthritis    Tubular adenoma of colon    Urinary frequency    Urinary frequency    Urinary urgency    Vitamin D deficiency      SURGICAL HISTORY: Past Surgical History:  Procedure Laterality Date   APPENDECTOMY     CATARACT EXTRACTION     Right (1996); Left (2007)   COLONOSCOPY WITH PROPOFOL N/A 05/08/2016   Procedure: COLONOSCOPY WITH PROPOFOL;  Surgeon: Lollie Sails, MD;  Location: [facility] ENDOSCOPY;  Service: Endoscopy;  Laterality: N/A;   Colonoscopy with propofol (N/A)  12/21/2021   EXTRACORPOREAL SHOCK WAVE LITHOTRIPSY Right 05/27/2015   Procedure: EXTRACORPOREAL SHOCK WAVE LITHOTRIPSY (ESWL);  Surgeon: Hollice Espy, MD;  Location: [facility] ORS;  Service: Urology;  Laterality: Right;   EYE SURGERY     LITHOTRIPSY     ployp removal  12/21/2021   5 polylps removed @ [facility]   RETINAL DETACHMENT SURGERY      SOCIAL HISTORY: Social History   Socioeconomic History   Marital status: Married    Spouse name: Not on file   Number of children: Not on file   Years of education: Not on file   Highest education level: Not on file  Occupational History   Not on file  Tobacco Use   Smoking status: Former   Smokeless tobacco: Former    Quit date: 05/26/1975  Vaping Use   Vaping Use: Never used  Substance and Sexual Activity   Alcohol use: Yes    Alcohol/week: 1.0 standard drink of alcohol    Types: 1 Standard drinks or equivalent per week    Comment: occasional    Drug use: No   Sexual activity: Not on file  Other Topics Concern   Not on file  Social History Narrative   Not on file   Social Determinants of Health   Financial Resource Strain: Not on file  Food Insecurity: Not on file  Transportation Needs: Not on file  Physical Activity: Not on file  Stress: Not on file  Social Connections: Not on file  Intimate Partner Violence: Not on file    FAMILY HISTORY: Family History  Problem Relation Age of Onset   Prostate cancer Father    Alzheimer's disease Father    Diabetes Mother    Stroke Mother     ALLERGIES:  has No Known Allergies.  MEDICATIONS:  Current Outpatient Medications   Medication Sig Dispense Refill   amLODipine (NORVASC) 5 MG tablet Take 5 mg by mouth daily.     B Complex Vitamins (VITAMIN B COMPLEX) TABS Take 1 tablet by mouth daily.     brimonidine (ALPHAGAN) 0.2 % ophthalmic solution 1 drop 2 (two) times daily.     bromfenac (XIBROM) 0.09 % ophthalmic solution Apply 1 drop to eye 2 (two) times daily.      Cholecalciferol (VITAMIN D-3) 1000 UNITS CAPS Take 2 capsules by mouth daily.      Docosahexaenoic Acid (DHA COMPLETE PO) Take by mouth.     Dorzolamide HCl-Timolol Mal PF 22.3-6.8 MG/ML SOLN Place 1 drop into both eyes 2 (two) times daily.     latanoprost (XALATAN) 0.005 % ophthalmic solution Apply 1 drop to eye at bedtime.      losartan (COZAAR) 50 MG tablet Take 50 mg by mouth daily.     Multiple Vitamins-Minerals (MULTIVITAMIN ADULT PO) Take 1 tablet by mouth daily.     OVER THE COUNTER MEDICATION Resveratrol     ROCKLATAN 0.02-0.005 % SOLN Apply 1 drop to eye at bedtime.     Vit B6-Vit B12-Omega 3 Acids (VITAMIN B PLUS+ PO) Take 1 tablet by mouth daily.     vitamin C (ASCORBIC ACID) 500 MG tablet Take 2,000 mg by mouth daily.      vitamin E 400 UNIT capsule Take 400 Units by mouth daily.     Zinc 50 MG CAPS Take by mouth daily.     MYRBETRIQ 25 MG TB24 tablet Take 25 mg by mouth daily.     No current facility-administered medications for this visit.      Marland Kitchen  PHYSICAL EXAMINATION: ECOG PERFORMANCE STATUS: 0 - Asymptomatic  Vitals:   01/22/23 1300  BP: 127/79  Pulse: 73  Resp: 16  Temp: (!) 96.6 F (35.9 C)   Filed Weights   01/22/23 1300  Weight: 160 lb 9.3 oz (72.8 kg)    Physical Exam Vitals and nursing note reviewed.  Constitutional:      Comments:       HENT:     Head: Normocephalic and atraumatic.     Mouth/Throat:     Pharynx: Oropharynx is clear.  Eyes:     Extraocular Movements: Extraocular movements intact.     Pupils: Pupils are equal, round, and reactive to light.  Cardiovascular:     Rate and Rhythm:  Normal rate and regular rhythm.  Pulmonary:     Comments: Decreased breath sounds bilaterally.  Abdominal:     Palpations: Abdomen is soft.  Musculoskeletal:        General: Normal range of motion.     Cervical back: Normal range of motion.  Skin:    General: Skin is warm.  Neurological:     General:  No focal deficit present.     Mental Status: He is alert and oriented to person, place, and time.  Psychiatric:        Behavior: Behavior normal.        Judgment: Judgment normal.      LABORATORY DATA:  I have reviewed the data as listed Lab Results  Component Value Date   WBC 7.3 01/19/2023   HGB 14.0 01/19/2023   HCT 41.7 01/19/2023   MCV 92.7 01/19/2023   PLT 253 01/19/2023   Recent Labs    07/11/22 1105 01/19/23 1400  NA 138 137  K 3.8 4.1  CL 108 104  CO2 26 24  GLUCOSE 109* 90  BUN 15 16  CREATININE 0.99 0.81  CALCIUM 8.6* 9.0  GFRNONAA >60 >60  PROT 8.0 8.2*  ALBUMIN 4.1 4.2  AST 22 22  ALT 16 16  ALKPHOS 79 64  BILITOT 0.8 0.7    RADIOGRAPHIC STUDIES: I have personally reviewed the radiological images as listed and agreed with the findings in the report. No results found.  ASSESSMENT & PLAN:   Monoclonal gammopathy of unknown significance (MGUS) #  Monoclonal gammopathy of unknown significance (MGUS).  M-protein is IgG with lambda light chain specificity.  No evidence of any organ dysfunction. Continue monitoring on 6 monthly basis.  # JULY  2023-1.0; K/L=WNL.  Cbc/cmp; WNL: MM/K/L light chains  from today pending.  # HTN: consistently 130-140/80-90s- even at home.  # DISPOSITION: # follow up- in 6 months- MD; 1 week - labs- cbc/cmp/MM panel; K/L light chains- Dr.B    All questions were answered. The patient knows to call the clinic with any problems, questions or concerns.       Cammie Sickle, MD 01/22/2023 1:55 PM

## 2023-01-31 LAB — MULTIPLE MYELOMA PANEL, SERUM
Albumin SerPl Elph-Mcnc: 3.9 g/dL (ref 2.9–4.4)
Albumin/Glob SerPl: 1.2 (ref 0.7–1.7)
Alpha 1: 0.2 g/dL (ref 0.0–0.4)
Alpha2 Glob SerPl Elph-Mcnc: 0.6 g/dL (ref 0.4–1.0)
B-Globulin SerPl Elph-Mcnc: 1 g/dL (ref 0.7–1.3)
Gamma Glob SerPl Elph-Mcnc: 1.7 g/dL (ref 0.4–1.8)
Globulin, Total: 3.5 g/dL (ref 2.2–3.9)
IgA: 284 mg/dL (ref 61–437)
IgG (Immunoglobin G), Serum: 1806 mg/dL — ABNORMAL HIGH (ref 603–1613)
IgM (Immunoglobulin M), Srm: 102 mg/dL (ref 15–143)
M Protein SerPl Elph-Mcnc: 0.6 g/dL — ABNORMAL HIGH
Total Protein ELP: 7.4 g/dL (ref 6.0–8.5)

## 2023-07-16 ENCOUNTER — Inpatient Hospital Stay: Payer: PPO | Attending: Internal Medicine

## 2023-07-16 DIAGNOSIS — D472 Monoclonal gammopathy: Secondary | ICD-10-CM | POA: Insufficient documentation

## 2023-07-16 DIAGNOSIS — I10 Essential (primary) hypertension: Secondary | ICD-10-CM | POA: Diagnosis not present

## 2023-07-16 DIAGNOSIS — Z8042 Family history of malignant neoplasm of prostate: Secondary | ICD-10-CM | POA: Insufficient documentation

## 2023-07-16 DIAGNOSIS — Z87891 Personal history of nicotine dependence: Secondary | ICD-10-CM | POA: Diagnosis not present

## 2023-07-16 LAB — COMPREHENSIVE METABOLIC PANEL
ALT: 15 U/L (ref 0–44)
AST: 19 U/L (ref 15–41)
Albumin: 4.1 g/dL (ref 3.5–5.0)
Alkaline Phosphatase: 68 U/L (ref 38–126)
Anion gap: 8 (ref 5–15)
BUN: 12 mg/dL (ref 8–23)
CO2: 23 mmol/L (ref 22–32)
Calcium: 9 mg/dL (ref 8.9–10.3)
Chloride: 106 mmol/L (ref 98–111)
Creatinine, Ser: 0.84 mg/dL (ref 0.61–1.24)
GFR, Estimated: >60 mL/min (ref >60)
Glucose, Bld: 104 mg/dL — ABNORMAL HIGH (ref 70–99)
Potassium: 3.9 mmol/L (ref 3.5–5.1)
Sodium: 137 mmol/L (ref 135–145)
Total Bilirubin: 0.6 mg/dL (ref 0.3–1.2)
Total Protein: 7.9 g/dL (ref 6.5–8.1)

## 2023-07-16 LAB — CBC WITH DIFFERENTIAL/PLATELET
Abs Immature Granulocytes: 0.01 10*3/uL (ref 0.00–0.07)
Basophils Absolute: 0 10*3/uL (ref 0.0–0.1)
Basophils Relative: 0 %
Eosinophils Absolute: 0.1 10*3/uL (ref 0.0–0.5)
Eosinophils Relative: 2 %
HCT: 41.3 % (ref 39.0–52.0)
Hemoglobin: 13.8 g/dL (ref 13.0–17.0)
Immature Granulocytes: 0 %
Lymphocytes Relative: 32 %
Lymphs Abs: 2.2 10*3/uL (ref 0.7–4.0)
MCH: 30.7 pg (ref 26.0–34.0)
MCHC: 33.4 g/dL (ref 30.0–36.0)
MCV: 92 fL (ref 80.0–100.0)
Monocytes Absolute: 0.4 10*3/uL (ref 0.1–1.0)
Monocytes Relative: 7 %
Neutro Abs: 4 10*3/uL (ref 1.7–7.7)
Neutrophils Relative %: 59 %
Platelets: 234 10*3/uL (ref 150–400)
RBC: 4.49 MIL/uL (ref 4.22–5.81)
RDW: 12.4 % (ref 11.5–15.5)
WBC: 6.7 10*3/uL (ref 4.0–10.5)
nRBC: 0 % (ref 0.0–0.2)

## 2023-07-17 LAB — KAPPA/LAMBDA LIGHT CHAINS
Kappa free light chain: 17.7 mg/L (ref 3.3–19.4)
Kappa, lambda light chain ratio: 0.68 (ref 0.26–1.65)
Lambda free light chains: 25.9 mg/L (ref 5.7–26.3)

## 2023-07-19 LAB — MULTIPLE MYELOMA PANEL, SERUM
Albumin SerPl Elph-Mcnc: 3.8 g/dL (ref 2.9–4.4)
Albumin/Glob SerPl: 1.2 (ref 0.7–1.7)
Alpha 1: 0.2 g/dL (ref 0.0–0.4)
Alpha2 Glob SerPl Elph-Mcnc: 0.6 g/dL (ref 0.4–1.0)
B-Globulin SerPl Elph-Mcnc: 0.9 g/dL (ref 0.7–1.3)
Gamma Glob SerPl Elph-Mcnc: 1.7 g/dL (ref 0.4–1.8)
IgA: 279 mg/dL (ref 61–437)
IgG (Immunoglobin G), Serum: 1852 mg/dL — ABNORMAL HIGH (ref 603–1613)
IgM (Immunoglobulin M), Srm: 101 mg/dL (ref 15–143)
M Protein SerPl Elph-Mcnc: 0.8 g/dL — ABNORMAL HIGH
Total Protein ELP: 7.2 g/dL (ref 6.0–8.5)

## 2023-07-23 ENCOUNTER — Inpatient Hospital Stay: Payer: PPO | Admitting: Internal Medicine

## 2023-07-23 ENCOUNTER — Encounter: Payer: Self-pay | Admitting: Internal Medicine

## 2023-07-23 DIAGNOSIS — D472 Monoclonal gammopathy: Secondary | ICD-10-CM

## 2023-07-23 NOTE — Progress Notes (Signed)
C/o elevated protein levels.

## 2023-07-23 NOTE — Progress Notes (Signed)
[facility] CONSULT NOTE  Patient Care Team: Cleon Dew, FNP as PCP - General (Family Medicine)  CHIEF COMPLAINTS/PURPOSE OF CONSULTATION: MGUS  # MGUS  # COLONOSCOPY [dec 2022; [facility]]- repeat colo in 2025 Oncology History   No history exists.    HISTORY OF PRESENTING ILLNESS: Ambulating independently.  Accompanied by his wife.  Dylan Young 77 y.o.  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   Denies any worsening joint pains or nausea vomiting. No fatigue.   Review of Systems  Constitutional:  Negative for chills, diaphoresis, fever, malaise/fatigue and weight loss.  HENT:  Negative for nosebleeds and sore throat.   Eyes:  Negative for double vision.  Respiratory:  Negative for cough, hemoptysis, sputum production, shortness of breath and wheezing.   Cardiovascular:  Negative for chest pain, palpitations, orthopnea and leg swelling.  Gastrointestinal:  Negative for abdominal pain, blood in stool, constipation, diarrhea, heartburn, melena, nausea and vomiting.  Genitourinary:  Negative for dysuria, frequency and urgency.  Musculoskeletal:  Negative for back pain and joint pain.  Skin: Negative.  Negative for itching and rash.  Neurological:  Negative for dizziness, tingling, focal weakness, weakness and headaches.  Endo/Heme/Allergies:  Does not bruise/bleed easily.  Psychiatric/Behavioral:  Negative for depression. The patient is not nervous/anxious and does not have insomnia.      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)    Dysphagia    ED (erectile dysfunction)    Glaucoma    Heart burn    History of stomach ulcers    Hyperlipidemia    Hypogonadism male    Kidney stones    Obesity    Osteoarthritis    Tubular adenoma of colon    Urinary frequency    Urinary frequency    Urinary urgency    Vitamin D deficiency     SURGICAL HISTORY: Past Surgical History:  Procedure Laterality Date   APPENDECTOMY     CATARACT EXTRACTION     Right (1996); Left (2007)   COLONOSCOPY WITH PROPOFOL N/A 05/08/2016   Procedure: COLONOSCOPY WITH PROPOFOL;  Surgeon: Christena Deem, MD;  Location: [facility] ENDOSCOPY;  Service: Endoscopy;  Laterality: N/A;   Colonoscopy with propofol (N/A)  12/21/2021   EXTRACORPOREAL SHOCK WAVE LITHOTRIPSY Right 05/27/2015   Procedure: EXTRACORPOREAL SHOCK WAVE LITHOTRIPSY (ESWL);  Surgeon: Vanna Scotland, MD;  Location: [facility] ORS;  Service: Urology;  Laterality: Right;   EYE SURGERY     LITHOTRIPSY     ployp removal  12/21/2021   5 polylps removed @ [facility]   RETINAL DETACHMENT SURGERY      SOCIAL HISTORY: Social History   Socioeconomic History   Marital status: Married    Spouse name: Not on file   Number of children: Not on file   Years of education: Not on file   Highest education level: Not on file  Occupational History   Not on file  Tobacco Use   Smoking status: Former   Smokeless tobacco: Former    Quit date: 05/26/1975  Vaping Use   Vaping status: Never Used  Substance and Sexual Activity   Alcohol use: Yes    Alcohol/week: 1.0 standard drink of alcohol    Types: 1 Standard drinks or equivalent per week    Comment: occasional    Drug use: No   Sexual activity: Not on file  Other  Topics Concern   Not on file  Social History Narrative   Not on file   Social Determinants of Health   Financial Resource Strain: Low Risk  (01/25/2022)   Received from [facility], [facility]   Overall Financial Resource Strain (CARDIA)    Difficulty of Paying Living Expenses: Not hard at all  Food Insecurity: No Food Insecurity (01/25/2022)   Received from [facility], [facility]   Hunger Vital Sign    Worried About Running Out of Food in the Last Year: Never true    Ran Out of Food in the Last Year: Never true  Transportation Needs: No Transportation Needs (01/25/2022)    Received from [facility]   [facility] - Transportation    Lack of Transportation (Medical): No    Lack of Transportation (Non-Medical): No  Physical Activity: Sufficiently Active (01/25/2022)   Received from [facility], [facility]   Exercise Vital Sign    Days of Exercise per Week: 3 days    Minutes of Exercise per Session: 60 min  Stress: No Stress Concern Present (01/25/2022)   Received from [facility], [facility] of Occupational Health - Occupational Stress Questionnaire    Feeling of Stress : Not at all  Social Connections: Unknown (01/25/2022)   Received from [facility], [facility]   Social Connection and Isolation Panel [NHANES]    Frequency of Communication with Friends and Family: More than three times a week    Frequency of Social Gatherings with Friends and Family: Twice a week    Attends Religious Services: Patient declined    Active Member of Clubs or Organizations: Patient declined    Attends Banker Meetings: Patient declined    Marital Status: Married  Catering manager Violence: Not At Risk (01/25/2022)   Received from [facility], [facility]   Humiliation, Afraid, Rape, and Kick questionnaire    Fear of Current or Ex-Partner: No    Emotionally Abused: No    Physically Abused: No    Sexually Abused: No    FAMILY HISTORY: Family History  Problem Relation Age of Onset   Diabetes Mother    Stroke Mother    Prostate cancer Father    Alzheimer's disease Father    Leukemia Brother     ALLERGIES:  has No Known Allergies.  MEDICATIONS:  Current Outpatient Medications  Medication Sig Dispense Refill   amLODipine (NORVASC) 5 MG tablet Take 5 mg by mouth daily.     B Complex Vitamins (VITAMIN B COMPLEX) TABS Take 1 tablet by mouth daily.     brimonidine (ALPHAGAN) 0.2 % ophthalmic solution 1 drop 2 (two) times daily.     bromfenac (XIBROM) 0.09 % ophthalmic solution Apply 1 drop  to eye 2 (two) times daily.      Cholecalciferol (VITAMIN D-3) 1000 UNITS CAPS Take 2 capsules by mouth daily.      Docosahexaenoic Acid (DHA COMPLETE PO) Take by mouth.     Dorzolamide HCl-Timolol Mal PF 22.3-6.8 MG/ML SOLN Place 1 drop into both eyes 2 (two) times daily.     latanoprost (XALATAN) 0.005 % ophthalmic solution Apply 1 drop to eye at bedtime.      losartan (COZAAR) 50 MG tablet Take 50 mg by mouth daily.     Multiple Vitamins-Minerals (MULTIVITAMIN ADULT PO) Take 1 tablet by mouth daily.     OVER THE  COUNTER MEDICATION Resveratrol     ROCKLATAN 0.02-0.005 % SOLN Apply 1 drop to eye at bedtime.     tamsulosin (FLOMAX) 0.4 MG CAPS capsule Take 1 capsule by mouth daily.     Vit B6-Vit B12-Omega 3 Acids (VITAMIN B PLUS+ PO) Take 1 tablet by mouth daily.     vitamin C (ASCORBIC ACID) 500 MG tablet Take 2,000 mg by mouth daily.      vitamin E 400 UNIT capsule Take 400 Units by mouth daily.     Zinc 50 MG CAPS Take by mouth daily.     No current facility-administered medications for this visit.    PHYSICAL EXAMINATION: ECOG PERFORMANCE STATUS: 0 - Asymptomatic  Vitals:   07/23/23 1250  BP: 136/73  Pulse: (!) 59  Temp: 97.9 F (36.6 C)  SpO2: 99%   Filed Weights   07/23/23 1250  Weight: 160 lb (72.6 kg)    Physical Exam Vitals and nursing note reviewed.  Constitutional:      Comments:       HENT:     Head: Normocephalic and atraumatic.     Mouth/Throat:     Pharynx: Oropharynx is clear.  Eyes:     Extraocular Movements: Extraocular movements intact.     Pupils: Pupils are equal, round, and reactive to light.  Cardiovascular:     Rate and Rhythm: Normal rate and regular rhythm.  Pulmonary:     Comments: Decreased breath sounds bilaterally.  Abdominal:     Palpations: Abdomen is soft.  Musculoskeletal:        General: Normal range of motion.     Cervical back: Normal range of motion.  Skin:    General: Skin is warm.  Neurological:     General: No  focal deficit present.     Mental Status: He is alert and oriented to person, place, and time.  Psychiatric:        Behavior: Behavior normal.        Judgment: Judgment normal.      LABORATORY DATA:  I have reviewed the data as listed Lab Results  Component Value Date   WBC 6.7 07/16/2023   HGB 13.8 07/16/2023   HCT 41.3 07/16/2023   MCV 92.0 07/16/2023   PLT 234 07/16/2023   Recent Labs    01/19/23 1400 07/16/23 1142  NA 137 137  K 4.1 3.9  CL 104 106  CO2 24 23  GLUCOSE 90 104*  BUN 16 12  CREATININE 0.81 0.84  CALCIUM 9.0 9.0  GFRNONAA >60 >60  PROT 8.2* 7.9  ALBUMIN 4.2 4.1  AST 22 19  ALT 16 15  ALKPHOS 64 68  BILITOT 0.7 0.6    RADIOGRAPHIC STUDIES: I have personally reviewed the radiological images as listed and agreed with the findings in the report. No results found.  ASSESSMENT & PLAN:   Monoclonal gammopathy of unknown significance (MGUS) #  Monoclonal gammopathy of unknown significance (MGUS).  M-protein is IgG with lambda light chain specificity.  No evidence of any organ dysfunction. Continue monitoring on 6 monthly basis.  # JULY  2024-10.8 K/L=WNL.  Cbc/cmp; WNL: MM/K/L light chains  from today pending.  # HTN: consistently 130-140/80-90s- even at home.  # DISPOSITION: # follow up- in 6 months- MD; 2  week - labs- cbc/cmp/MM panel; K/L light chains- Dr.B     All questions were answered. The patient knows to call the clinic with any problems, questions or concerns.  Earna Coder, MD 07/23/2023 1:17 PM

## 2023-07-23 NOTE — Assessment & Plan Note (Addendum)
#    Monoclonal gammopathy of unknown significance (MGUS).  M-protein is IgG with lambda light chain specificity.  No evidence of any organ dysfunction. Continue monitoring on 6 monthly basis.  # JULY  2024-10.8 K/L=WNL.  Cbc/cmp; WNL: MM/K/L light chains  from today pending.  # HTN: consistently 130-140/80-90s- even at home.  # DISPOSITION: # follow up- in 6 months- MD; 2  week - labs- cbc/cmp/MM panel; K/L light chains- Dr.B

## 2024-01-09 ENCOUNTER — Inpatient Hospital Stay: Payer: PPO | Attending: Internal Medicine

## 2024-01-09 DIAGNOSIS — I10 Essential (primary) hypertension: Secondary | ICD-10-CM | POA: Insufficient documentation

## 2024-01-09 DIAGNOSIS — Z8042 Family history of malignant neoplasm of prostate: Secondary | ICD-10-CM | POA: Insufficient documentation

## 2024-01-09 DIAGNOSIS — D472 Monoclonal gammopathy: Secondary | ICD-10-CM | POA: Diagnosis present

## 2024-01-09 DIAGNOSIS — Z806 Family history of leukemia: Secondary | ICD-10-CM | POA: Diagnosis not present

## 2024-01-09 DIAGNOSIS — Z87891 Personal history of nicotine dependence: Secondary | ICD-10-CM | POA: Insufficient documentation

## 2024-01-09 LAB — CMP (CANCER CENTER ONLY)
ALT: 16 U/L (ref 0–44)
AST: 20 U/L (ref 15–41)
Albumin: 4.4 g/dL (ref 3.5–5.0)
Alkaline Phosphatase: 83 U/L (ref 38–126)
Anion gap: 10 (ref 5–15)
BUN: 18 mg/dL (ref 8–23)
CO2: 23 mmol/L (ref 22–32)
Calcium: 9.1 mg/dL (ref 8.9–10.3)
Chloride: 104 mmol/L (ref 98–111)
Creatinine: 0.85 mg/dL (ref 0.61–1.24)
GFR, Estimated: >60 mL/min (ref >60)
Glucose, Bld: 106 mg/dL — ABNORMAL HIGH (ref 70–99)
Potassium: 4 mmol/L (ref 3.5–5.1)
Sodium: 137 mmol/L (ref 135–145)
Total Bilirubin: 0.8 mg/dL (ref 0.0–1.2)
Total Protein: 8.2 g/dL — ABNORMAL HIGH (ref 6.5–8.1)

## 2024-01-09 LAB — CBC WITH DIFFERENTIAL (CANCER CENTER ONLY)
Abs Immature Granulocytes: 0.03 10*3/uL (ref 0.00–0.07)
Basophils Absolute: 0 10*3/uL (ref 0.0–0.1)
Basophils Relative: 0 %
Eosinophils Absolute: 0.1 10*3/uL (ref 0.0–0.5)
Eosinophils Relative: 1 %
HCT: 43.1 % (ref 39.0–52.0)
Hemoglobin: 14.5 g/dL (ref 13.0–17.0)
Immature Granulocytes: 0 %
Lymphocytes Relative: 24 %
Lymphs Abs: 2.2 10*3/uL (ref 0.7–4.0)
MCH: 31 pg (ref 26.0–34.0)
MCHC: 33.6 g/dL (ref 30.0–36.0)
MCV: 92.1 fL (ref 80.0–100.0)
Monocytes Absolute: 0.6 10*3/uL (ref 0.1–1.0)
Monocytes Relative: 7 %
Neutro Abs: 6 10*3/uL (ref 1.7–7.7)
Neutrophils Relative %: 68 %
Platelet Count: 285 10*3/uL (ref 150–400)
RBC: 4.68 MIL/uL (ref 4.22–5.81)
RDW: 12.6 % (ref 11.5–15.5)
WBC Count: 9 10*3/uL (ref 4.0–10.5)
nRBC: 0 % (ref 0.0–0.2)

## 2024-01-10 LAB — KAPPA/LAMBDA LIGHT CHAINS
Kappa free light chain: 20.5 mg/L — ABNORMAL HIGH (ref 3.3–19.4)
Kappa, lambda light chain ratio: 0.74 (ref 0.26–1.65)
Lambda free light chains: 27.8 mg/L — ABNORMAL HIGH (ref 5.7–26.3)

## 2024-01-16 LAB — MULTIPLE MYELOMA PANEL, SERUM
Albumin SerPl Elph-Mcnc: 4.2 g/dL (ref 2.9–4.4)
Albumin/Glob SerPl: 1.2 (ref 0.7–1.7)
Alpha 1: 0.2 g/dL (ref 0.0–0.4)
Alpha2 Glob SerPl Elph-Mcnc: 0.6 g/dL (ref 0.4–1.0)
B-Globulin SerPl Elph-Mcnc: 1 g/dL (ref 0.7–1.3)
Gamma Glob SerPl Elph-Mcnc: 1.8 g/dL (ref 0.4–1.8)
Globulin, Total: 3.6 g/dL (ref 2.2–3.9)
IgA: 286 mg/dL (ref 61–437)
IgG (Immunoglobin G), Serum: 1915 mg/dL — ABNORMAL HIGH (ref 603–1613)
IgM (Immunoglobulin M), Srm: 105 mg/dL (ref 15–143)
M Protein SerPl Elph-Mcnc: 0.7 g/dL — ABNORMAL HIGH
Total Protein ELP: 7.8 g/dL (ref 6.0–8.5)

## 2024-01-23 ENCOUNTER — Inpatient Hospital Stay: Payer: PPO | Admitting: Internal Medicine

## 2024-01-23 ENCOUNTER — Encounter: Payer: Self-pay | Admitting: Internal Medicine

## 2024-01-23 VITALS — BP 131/77 | HR 66 | Temp 96.7°F | Ht 66.0 in | Wt 161.4 lb

## 2024-01-23 DIAGNOSIS — D472 Monoclonal gammopathy: Secondary | ICD-10-CM

## 2024-01-23 NOTE — Progress Notes (Signed)
Lt eye surgery, drain placed 01/11/24 at [facility] Some pain at times 6/10.

## 2024-01-23 NOTE — Assessment & Plan Note (Signed)
#    Monoclonal gammopathy of unknown significance (MGUS).  M-protein is IgG with lambda light chain specificity.  No evidence of any organ dysfunction. No significant changes noted in last 5 years; so, Continue monitoring  but change to12  monthly basis.  # JAN 2025-0.7 K/L=WNL.  Cbc/cmp; WNL  # HTN: consistently 130-140/80-90s- even at home.  # DISPOSITION: # follow up- in 12  months- MD; 2  week - labs- cbc/cmp/MM panel; K/L light chains- Dr.B

## 2024-01-23 NOTE — Progress Notes (Signed)
[name] CONSULT NOTE  Patient Care Team: Cleon Dew, FNP as PCP - General (Family Medicine) Earna Coder, MD as Consulting Physician (Oncology)  CHIEF COMPLAINTS/PURPOSE OF CONSULTATION: MGUS  # MGUS  # COLONOSCOPY [dec 2022; [facility]]- repeat colo in 2025 Oncology History   No history exists.    HISTORY OF PRESENTING ILLNESS: Ambulating independently.  Accompanied by his wife.  Dylan Young 78 y.o.  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  Pt has a history of glaucoma . Currently s/p eye surgery, drain placed 01/11/24 at [facility].   Denies any worsening joint pains or nausea vomiting. No fatigue.   Review of Systems  Constitutional:  Negative for chills, diaphoresis, fever, malaise/fatigue and weight loss.  HENT:  Negative for nosebleeds and sore throat.   Eyes:  Negative for double vision.  Respiratory:  Negative for cough, hemoptysis, sputum production, shortness of breath and wheezing.   Cardiovascular:  Negative for chest pain, palpitations, orthopnea and leg swelling.  Gastrointestinal:  Negative for abdominal pain, blood in stool, constipation, diarrhea, heartburn, melena, nausea and vomiting.  Genitourinary:  Negative for dysuria, frequency and urgency.  Musculoskeletal:  Negative for back pain and joint pain.  Skin: Negative.  Negative for itching and rash.  Neurological:  Negative for dizziness, tingling, focal weakness, weakness and headaches.  Endo/Heme/Allergies:  Does not bruise/bleed easily.  Psychiatric/Behavioral:  Negative for depression. The patient is not nervous/anxious and does not have insomnia.      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)    Dysphagia    ED (erectile dysfunction)    Glaucoma    Heart burn    History of stomach ulcers    Hyperlipidemia    Hypogonadism male    Kidney stones     Obesity    Osteoarthritis    Tubular adenoma of colon    Urinary frequency    Urinary frequency    Urinary urgency    Vitamin D deficiency    SURGICAL HISTORY: Past Surgical History:  Procedure Laterality Date   APPENDECTOMY     CATARACT EXTRACTION     Right (1996); Left (2007)   COLONOSCOPY WITH PROPOFOL N/A 05/08/2016   Procedure: COLONOSCOPY WITH PROPOFOL;  Surgeon: Christena Deem, MD;  Location: [facility] ENDOSCOPY;  Service: Endoscopy;  Laterality: N/A;   Colonoscopy with propofol (N/A)  12/21/2021   EXTRACORPOREAL SHOCK WAVE LITHOTRIPSY Right 05/27/2015   Procedure: EXTRACORPOREAL SHOCK WAVE LITHOTRIPSY (ESWL);  Surgeon: Vanna Scotland, MD;  Location: [facility] ORS;  Service: Urology;  Laterality: Right;   EYE SURGERY     LITHOTRIPSY     ployp removal  12/21/2021   5 polylps removed @ [facility]   RETINAL DETACHMENT SURGERY      SOCIAL HISTORY: Social History   Socioeconomic History   Marital status: Married    Spouse name: Not on file   Number of children: Not on file   Years of education: Not on file   Highest education level: Not on file  Occupational History   Not on file  Tobacco Use   Smoking status: Former   Smokeless tobacco: Former    Quit date: 05/26/1975  Vaping Use   Vaping status: Never Used  Substance and Sexual Activity   Alcohol use: Yes    Alcohol/week: 1.0 standard drink of alcohol    Types: 1 Standard  drinks or equivalent per week    Comment: occasional    Drug use: No   Sexual activity: Not on file  Other Topics Concern   Not on file  Social History Narrative   Not on file   Social Drivers of Health   Financial Resource Strain: Low Risk  (01/25/2022)   Received from [facility], [facility]   Overall Financial Resource Strain (CARDIA)    Difficulty of Paying Living Expenses: Not hard at all  Food Insecurity: No Food Insecurity (10/24/2023)   Received from [facility]   Hunger Vital Sign    Worried About Running Out of Food in the  Last Year: Never true    Ran Out of Food in the Last Year: Never true  Transportation Needs: No Transportation Needs (10/24/2023)   Received from [facility] - Transportation    Lack of Transportation (Medical): No    Lack of Transportation (Non-Medical): No  Physical Activity: Sufficiently Active (01/25/2022)   Received from [facility]   Exercise Vital Sign    Days of Exercise per Week: 3 days    Minutes of Exercise per Session: 60 min  Stress: No Stress Concern Present (01/25/2022)   Received from [facility], [facility] of Occupational Health - Occupational Stress Questionnaire    Feeling of Stress : Not at all  Social Connections: Unknown (01/25/2022)   Received from [facility], [facility]   Social Connection and Isolation Panel [NHANES]    Frequency of Communication with Friends and Family: More than three times a week    Frequency of Social Gatherings with Friends and Family: Twice a week    Attends Religious Services: Patient declined    Active Member of Clubs or Organizations: Patient declined    Attends Banker Meetings: Patient declined    Marital Status: Married  Catering manager Violence: Not At Risk (01/25/2022)   Received from [facility], [facility]   Humiliation, Afraid, Rape, and Kick questionnaire    Fear of Current or Ex-Partner: No    Emotionally Abused: No    Physically Abused: No    Sexually Abused: No    FAMILY HISTORY: Family History  Problem Relation Age of Onset   Diabetes Mother    Stroke Mother    Prostate cancer Father    Alzheimer's disease Father    Leukemia Brother     ALLERGIES:  has no known allergies.  MEDICATIONS:  Current Outpatient Medications  Medication Sig Dispense Refill   amLODipine (NORVASC) 5 MG tablet Take 5 mg by mouth daily.     B Complex Vitamins (VITAMIN B COMPLEX) TABS Take 1 tablet by mouth daily.     brimonidine (ALPHAGAN) 0.2  % ophthalmic solution 1 drop 2 (two) times daily.     bromfenac (XIBROM) 0.09 % ophthalmic solution Apply 1 drop to eye 2 (two) times daily.      Cholecalciferol (VITAMIN D-3) 1000 UNITS CAPS Take 2 capsules by mouth daily.      Docosahexaenoic Acid (DHA COMPLETE PO) Take by mouth.     Dorzolamide HCl-Timolol Mal PF 22.3-6.8 MG/ML SOLN Place 1 drop into both eyes 2 (two) times daily.     latanoprost (XALATAN) 0.005 % ophthalmic solution Apply 1 drop to eye at bedtime.      losartan (COZAAR) 50 MG tablet Take 50 mg by mouth daily.  Multiple Vitamins-Minerals (MULTIVITAMIN ADULT PO) Take 1 tablet by mouth daily.     OVER THE COUNTER MEDICATION Resveratrol     ROCKLATAN 0.02-0.005 % SOLN Apply 1 drop to eye at bedtime.     tamsulosin (FLOMAX) 0.4 MG CAPS capsule Take 1 capsule by mouth daily.     Vit B6-Vit B12-Omega 3 Acids (VITAMIN B PLUS+ PO) Take 1 tablet by mouth daily.     vitamin C (ASCORBIC ACID) 500 MG tablet Take 2,000 mg by mouth daily.      vitamin E 400 UNIT capsule Take 400 Units by mouth daily.     Zinc 50 MG CAPS Take by mouth daily.     No current facility-administered medications for this visit.    PHYSICAL EXAMINATION: ECOG PERFORMANCE STATUS: 0 - Asymptomatic  Vitals:   01/23/24 1307  BP: 131/77  Pulse: 66  Temp: (!) 96.7 F (35.9 C)  SpO2: 99%   Filed Weights   01/23/24 1307  Weight: 161 lb 6.4 oz (73.2 kg)    Physical Exam Vitals and nursing note reviewed.  Constitutional:      Comments:       HENT:     Head: Normocephalic and atraumatic.     Mouth/Throat:     Pharynx: Oropharynx is clear.  Eyes:     Extraocular Movements: Extraocular movements intact.     Pupils: Pupils are equal, round, and reactive to light.  Cardiovascular:     Rate and Rhythm: Normal rate and regular rhythm.  Pulmonary:     Comments: Decreased breath sounds bilaterally.  Abdominal:     Palpations: Abdomen is soft.  Musculoskeletal:        General: Normal range of  motion.     Cervical back: Normal range of motion.  Skin:    General: Skin is warm.  Neurological:     General: No focal deficit present.     Mental Status: He is alert and oriented to person, place, and time.  Psychiatric:        Behavior: Behavior normal.        Judgment: Judgment normal.      LABORATORY DATA:  I have reviewed the data as listed Lab Results  Component Value Date   WBC 9.0 01/09/2024   HGB 14.5 01/09/2024   HCT 43.1 01/09/2024   MCV 92.1 01/09/2024   PLT 285 01/09/2024   Recent Labs    07/16/23 1142 01/09/24 1120  NA 137 137  K 3.9 4.0  CL 106 104  CO2 23 23  GLUCOSE 104* 106*  BUN 12 18  CREATININE 0.84 0.85  CALCIUM 9.0 9.1  GFRNONAA >60 >60  PROT 7.9 8.2*  ALBUMIN 4.1 4.4  AST 19 20  ALT 15 16  ALKPHOS 68 83  BILITOT 0.6 0.8    RADIOGRAPHIC STUDIES: I have personally reviewed the radiological images as listed and agreed with the findings in the report. No results found.  ASSESSMENT & PLAN:   Monoclonal gammopathy of unknown significance (MGUS) #  Monoclonal gammopathy of unknown significance (MGUS).  M-protein is IgG with lambda light chain specificity.  No evidence of any organ dysfunction. No significant changes noted in last 5 years; so, Continue monitoring  but change to12  monthly basis.  # JAN 2025-0.7 K/L=WNL.  Cbc/cmp; WNL  # HTN: consistently 130-140/80-90s- even at home.  # DISPOSITION: # follow up- in 12  months- MD; 2  week - labs- cbc/cmp/MM panel; K/L light chains- Dr.B  All questions were answered. The patient knows to call the clinic with any problems, questions or concerns.       Earna Coder, MD 01/23/2024 1:45 PM

## 2025-01-08 ENCOUNTER — Inpatient Hospital Stay: Payer: PPO | Attending: Internal Medicine

## 2025-01-08 DIAGNOSIS — D472 Monoclonal gammopathy: Secondary | ICD-10-CM | POA: Insufficient documentation

## 2025-01-08 DIAGNOSIS — I10 Essential (primary) hypertension: Secondary | ICD-10-CM | POA: Diagnosis not present

## 2025-01-08 LAB — CBC WITH DIFFERENTIAL (CANCER CENTER ONLY)
Abs Immature Granulocytes: 0.07 K/uL (ref 0.00–0.07)
Basophils Absolute: 0 K/uL (ref 0.0–0.1)
Basophils Relative: 0 %
Eosinophils Absolute: 0.1 K/uL (ref 0.0–0.5)
Eosinophils Relative: 1 %
HCT: 43.8 % (ref 39.0–52.0)
Hemoglobin: 14.7 g/dL (ref 13.0–17.0)
Immature Granulocytes: 1 %
Lymphocytes Relative: 20 %
Lymphs Abs: 2 K/uL (ref 0.7–4.0)
MCH: 30.9 pg (ref 26.0–34.0)
MCHC: 33.6 g/dL (ref 30.0–36.0)
MCV: 92 fL (ref 80.0–100.0)
Monocytes Absolute: 0.5 K/uL (ref 0.1–1.0)
Monocytes Relative: 5 %
Neutro Abs: 7.4 K/uL (ref 1.7–7.7)
Neutrophils Relative %: 73 %
Platelet Count: 283 K/uL (ref 150–400)
RBC: 4.76 MIL/uL (ref 4.22–5.81)
RDW: 12 % (ref 11.5–15.5)
WBC Count: 10.1 K/uL (ref 4.0–10.5)
nRBC: 0 % (ref 0.0–0.2)

## 2025-01-08 LAB — CMP (CANCER CENTER ONLY)
ALT: 22 U/L (ref 0–44)
AST: 22 U/L (ref 15–41)
Albumin: 4.4 g/dL (ref 3.5–5.0)
Alkaline Phosphatase: 112 U/L (ref 38–126)
Anion gap: 12 (ref 5–15)
BUN: 11 mg/dL (ref 8–23)
CO2: 25 mmol/L (ref 22–32)
Calcium: 9.7 mg/dL (ref 8.9–10.3)
Chloride: 101 mmol/L (ref 98–111)
Creatinine: 1.22 mg/dL (ref 0.61–1.24)
GFR, Estimated: >60 mL/min
Glucose, Bld: 121 mg/dL — ABNORMAL HIGH (ref 70–99)
Potassium: 4.2 mmol/L (ref 3.5–5.1)
Sodium: 138 mmol/L (ref 135–145)
Total Bilirubin: 0.6 mg/dL (ref 0.0–1.2)
Total Protein: 8.4 g/dL — ABNORMAL HIGH (ref 6.5–8.1)

## 2025-01-09 LAB — KAPPA/LAMBDA LIGHT CHAINS
Kappa free light chain: 25 mg/L — ABNORMAL HIGH (ref 3.3–19.4)
Kappa, lambda light chain ratio: 0.79 (ref 0.26–1.65)
Lambda free light chains: 31.5 mg/L — ABNORMAL HIGH (ref 5.7–26.3)

## 2025-01-10 LAB — MULTIPLE MYELOMA PANEL, SERUM
Albumin SerPl Elph-Mcnc: 3.7 g/dL (ref 2.9–4.4)
Albumin/Glob SerPl: 1 (ref 0.7–1.7)
Alpha 1: 0.2 g/dL (ref 0.0–0.4)
Alpha2 Glob SerPl Elph-Mcnc: 0.7 g/dL (ref 0.4–1.0)
B-Globulin SerPl Elph-Mcnc: 1.1 g/dL (ref 0.7–1.3)
Gamma Glob SerPl Elph-Mcnc: 2 g/dL — ABNORMAL HIGH (ref 0.4–1.8)
Globulin, Total: 4 g/dL — ABNORMAL HIGH (ref 2.2–3.9)
IgA: 354 mg/dL (ref 61–437)
IgG (Immunoglobin G), Serum: 2193 mg/dL — ABNORMAL HIGH (ref 603–1613)
IgM (Immunoglobulin M), Srm: 111 mg/dL (ref 15–143)
M Protein SerPl Elph-Mcnc: 0.7 g/dL — ABNORMAL HIGH
Total Protein ELP: 7.7 g/dL (ref 6.0–8.5)

## 2025-01-20 ENCOUNTER — Telehealth: Payer: Self-pay | Admitting: Internal Medicine

## 2025-01-20 NOTE — Telephone Encounter (Signed)
 Called patient to change appointment to mychart. Spoke to patient- he is ok for change. Appointment changed-

## 2025-01-21 ENCOUNTER — Encounter: Payer: Self-pay | Admitting: Internal Medicine

## 2025-01-21 ENCOUNTER — Inpatient Hospital Stay (HOSPITAL_BASED_OUTPATIENT_CLINIC_OR_DEPARTMENT_OTHER): Payer: PPO | Admitting: Internal Medicine

## 2025-01-21 DIAGNOSIS — D472 Monoclonal gammopathy: Secondary | ICD-10-CM

## 2025-01-21 DIAGNOSIS — I10 Essential (primary) hypertension: Secondary | ICD-10-CM

## 2025-01-21 NOTE — Addendum Note (Signed)
 Addended by: LAEL BROWNING A on: 01/21/2025 01:39 PM   Modules accepted: Orders

## 2025-01-21 NOTE — Assessment & Plan Note (Addendum)
#    Monoclonal gammopathy of unknown significance (MGUS).  M-protein is IgG with lambda light chain specificity.  No evidence of any organ dysfunction. No significant changes noted in last 5 years; so, Continue monitoring  but change to12  monthly basis.  # JAN 2026-0.7 K/L=WNL.  Cbc/cmp; WNL- stable.   # HTN: consistently 130-140/80-90s- even at home.  # DISPOSITION: # follow up- in 12  months- MD; 2  week - labs- cbc/cmp/MM panel; K/L light chains- Dr.B

## 2025-01-21 NOTE — Progress Notes (Signed)
 No concerns today.

## 2025-01-21 NOTE — Progress Notes (Signed)
 I connected with Dylan Young on 01/21/25 at  1:00 PM EST by video enabled telemedicine visit and verified that I am speaking with the correct person using two identifiers.  I discussed the limitations, risks, security and privacy concerns of performing an evaluation and management service by telemedicine and the availability of in-person appointments. I also discussed with the patient that there may be a patient responsible charge related to this service. The patient expressed understanding and agreed to proceed.    Other persons participating in the visit and their role in the encounter: RN/medical reconciliation Patients location: home Providers location: office  Oncology History   No problem history exists.     Chief Complaint: MGUS    History of present illness:Dylan Young 79 y.o.  male with history of MGUS is here for follow-up.  Patient denies any unusual joint pains bone pain or worsening back pain.  Denies any neuropathy.  Denies any worsening fatigue.  Patient of note had a workup for his ongoing vision issues related to glaucoma.  Otherwise denies any headaches nausea vomiting.  Observation/objective: Alert & oriented x 3. In No acute distress.   Assessment and plan: Monoclonal gammopathy of unknown significance (MGUS) #  Monoclonal gammopathy of unknown significance (MGUS).  M-protein is IgG with lambda light chain specificity.  No evidence of any organ dysfunction. No significant changes noted in last 5 years; so, Continue monitoring  but change to12  monthly basis.  # JAN 2026-0.7 K/L=WNL.  Cbc/cmp; WNL- stable.   # HTN: consistently 130-140/80-90s- even at home.  # DISPOSITION: # follow up- in 12  months- MD; 2  week - labs- cbc/cmp/MM panel; K/L light chains- Dr.B  Follow-up instructions: I discussed the assessment and treatment plan with the patient.  The patient was provided an opportunity to ask questions and all were answered.  The patient agreed  with the plan and demonstrated understanding of instructions. The patient was advised to call back or seek an in person evaluation if the symptoms worsen or if the condition fails to improve as anticipated.  Dr. Merlina Marchena [facility] at [facility] 01/21/2025 1:35 PM

## 2026-01-07 ENCOUNTER — Inpatient Hospital Stay

## 2026-01-21 ENCOUNTER — Inpatient Hospital Stay: Admitting: Internal Medicine
